# Patient Record
Sex: Female | Born: 1965 | Race: White | Hispanic: No | Marital: Married | Smoking: Never smoker
Health system: Southern US, Community
[De-identification: ages and names within clinical notes are randomized; demographics above are authoritative.]

## PROBLEM LIST (undated history)

## (undated) DIAGNOSIS — G8929 Other chronic pain: Secondary | ICD-10-CM

## (undated) DIAGNOSIS — E785 Hyperlipidemia, unspecified: Secondary | ICD-10-CM

## (undated) DIAGNOSIS — I10 Essential (primary) hypertension: Secondary | ICD-10-CM

## (undated) DIAGNOSIS — E119 Type 2 diabetes mellitus without complications: Secondary | ICD-10-CM

## (undated) DIAGNOSIS — T7840XA Allergy, unspecified, initial encounter: Secondary | ICD-10-CM

## (undated) HISTORY — DX: Allergy, unspecified, initial encounter: T78.40XA

## (undated) HISTORY — PX: ABDOMINAL HYSTERECTOMY: SHX81

## (undated) HISTORY — DX: Other chronic pain: G89.29

## (undated) HISTORY — DX: Hyperlipidemia, unspecified: E78.5

---

## 1998-01-23 ENCOUNTER — Ambulatory Visit (HOSPITAL_COMMUNITY): Admission: RE | Admit: 1998-01-23 | Discharge: 1998-01-23 | Payer: Self-pay | Admitting: Family Medicine

## 1999-04-29 ENCOUNTER — Other Ambulatory Visit: Admission: RE | Admit: 1999-04-29 | Discharge: 1999-04-29 | Payer: Self-pay | Admitting: Obstetrics & Gynecology

## 1999-08-30 ENCOUNTER — Encounter: Admission: RE | Admit: 1999-08-30 | Discharge: 1999-11-28 | Payer: Self-pay | Admitting: Obstetrics & Gynecology

## 1999-11-25 ENCOUNTER — Inpatient Hospital Stay (HOSPITAL_COMMUNITY): Admission: AD | Admit: 1999-11-25 | Discharge: 1999-11-28 | Payer: Self-pay | Admitting: Obstetrics and Gynecology

## 2000-01-06 ENCOUNTER — Other Ambulatory Visit: Admission: RE | Admit: 2000-01-06 | Discharge: 2000-01-06 | Payer: Self-pay | Admitting: Obstetrics & Gynecology

## 2004-04-08 ENCOUNTER — Ambulatory Visit: Admission: RE | Admit: 2004-04-08 | Discharge: 2004-04-08 | Payer: Self-pay | Admitting: Internal Medicine

## 2004-05-31 ENCOUNTER — Ambulatory Visit (HOSPITAL_COMMUNITY): Admission: RE | Admit: 2004-05-31 | Discharge: 2004-05-31 | Payer: Self-pay | Admitting: Family Medicine

## 2005-05-07 ENCOUNTER — Ambulatory Visit (HOSPITAL_COMMUNITY): Admission: RE | Admit: 2005-05-07 | Discharge: 2005-05-07 | Payer: Self-pay | Admitting: Family Medicine

## 2005-06-27 ENCOUNTER — Ambulatory Visit (HOSPITAL_COMMUNITY): Admission: RE | Admit: 2005-06-27 | Discharge: 2005-06-27 | Payer: Self-pay | Admitting: Family Medicine

## 2005-07-16 ENCOUNTER — Inpatient Hospital Stay (HOSPITAL_COMMUNITY): Admission: AD | Admit: 2005-07-16 | Discharge: 2005-07-16 | Payer: Self-pay | Admitting: Obstetrics & Gynecology

## 2005-07-30 ENCOUNTER — Inpatient Hospital Stay (HOSPITAL_COMMUNITY): Admission: AD | Admit: 2005-07-30 | Discharge: 2005-07-30 | Payer: Self-pay | Admitting: Obstetrics

## 2005-08-08 ENCOUNTER — Inpatient Hospital Stay (HOSPITAL_COMMUNITY): Admission: RE | Admit: 2005-08-08 | Discharge: 2005-08-11 | Payer: Self-pay | Admitting: Obstetrics

## 2006-02-09 ENCOUNTER — Ambulatory Visit (HOSPITAL_COMMUNITY): Admission: RE | Admit: 2006-02-09 | Discharge: 2006-02-09 | Payer: Self-pay | Admitting: Obstetrics

## 2008-09-22 HISTORY — PX: TOTAL ABDOMINAL HYSTERECTOMY W/ BILATERAL SALPINGOOPHORECTOMY: SHX83

## 2014-05-22 ENCOUNTER — Ambulatory Visit: Payer: Self-pay | Admitting: Internal Medicine

## 2014-07-26 ENCOUNTER — Ambulatory Visit: Payer: Self-pay | Admitting: Internal Medicine

## 2015-10-24 ENCOUNTER — Emergency Department (HOSPITAL_COMMUNITY): Payer: Commercial Managed Care - PPO

## 2015-10-24 ENCOUNTER — Emergency Department (HOSPITAL_COMMUNITY)
Admission: EM | Admit: 2015-10-24 | Discharge: 2015-10-24 | Disposition: A | Discharge status: Home / Self Care | Payer: Commercial Managed Care - PPO | Attending: Emergency Medicine | Admitting: Emergency Medicine

## 2015-10-24 ENCOUNTER — Encounter (HOSPITAL_COMMUNITY): Payer: Self-pay | Admitting: Emergency Medicine

## 2015-10-24 DIAGNOSIS — S29001A Unspecified injury of muscle and tendon of front wall of thorax, initial encounter: Secondary | ICD-10-CM | POA: Diagnosis not present

## 2015-10-24 DIAGNOSIS — Y998 Other external cause status: Secondary | ICD-10-CM | POA: Insufficient documentation

## 2015-10-24 DIAGNOSIS — Z79899 Other long term (current) drug therapy: Secondary | ICD-10-CM | POA: Insufficient documentation

## 2015-10-24 DIAGNOSIS — Y92410 Unspecified street and highway as the place of occurrence of the external cause: Secondary | ICD-10-CM | POA: Diagnosis not present

## 2015-10-24 DIAGNOSIS — Z7984 Long term (current) use of oral hypoglycemic drugs: Secondary | ICD-10-CM | POA: Insufficient documentation

## 2015-10-24 DIAGNOSIS — S4992XA Unspecified injury of left shoulder and upper arm, initial encounter: Secondary | ICD-10-CM | POA: Insufficient documentation

## 2015-10-24 DIAGNOSIS — S6992XA Unspecified injury of left wrist, hand and finger(s), initial encounter: Secondary | ICD-10-CM | POA: Diagnosis not present

## 2015-10-24 DIAGNOSIS — E119 Type 2 diabetes mellitus without complications: Secondary | ICD-10-CM | POA: Insufficient documentation

## 2015-10-24 DIAGNOSIS — Z880 Allergy status to penicillin: Secondary | ICD-10-CM | POA: Insufficient documentation

## 2015-10-24 DIAGNOSIS — I10 Essential (primary) hypertension: Secondary | ICD-10-CM | POA: Insufficient documentation

## 2015-10-24 DIAGNOSIS — Y9389 Activity, other specified: Secondary | ICD-10-CM | POA: Diagnosis not present

## 2015-10-24 HISTORY — DX: Type 2 diabetes mellitus without complications: E11.9

## 2015-10-24 HISTORY — DX: Essential (primary) hypertension: I10

## 2015-10-24 MED ORDER — HYDROCODONE-ACETAMINOPHEN 5-325 MG PO TABS: 2.0000 | ORAL_TABLET | ORAL | Status: DC | PRN

## 2015-10-24 MED ORDER — CYCLOBENZAPRINE HCL 10 MG PO TABS: 10.0000 mg | ORAL_TABLET | Freq: Three times a day (TID) | ORAL | Status: DC | PRN
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
## 2015-10-24 NOTE — Discharge Instructions (Signed)
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
## 2016-12-13 DIAGNOSIS — I10 Essential (primary) hypertension: Secondary | ICD-10-CM | POA: Diagnosis not present

## 2016-12-13 DIAGNOSIS — E119 Type 2 diabetes mellitus without complications: Secondary | ICD-10-CM | POA: Diagnosis not present

## 2016-12-15 DIAGNOSIS — K219 Gastro-esophageal reflux disease without esophagitis: Secondary | ICD-10-CM | POA: Diagnosis not present

## 2016-12-15 DIAGNOSIS — E118 Type 2 diabetes mellitus with unspecified complications: Secondary | ICD-10-CM | POA: Diagnosis not present
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
## 2017-04-15 DIAGNOSIS — E119 Type 2 diabetes mellitus without complications: Secondary | ICD-10-CM | POA: Diagnosis not present

## 2017-04-15 DIAGNOSIS — I110 Hypertensive heart disease with heart failure: Secondary | ICD-10-CM | POA: Diagnosis not present

## 2017-08-18 DIAGNOSIS — I10 Essential (primary) hypertension: Secondary | ICD-10-CM | POA: Diagnosis not present

## 2017-08-18 DIAGNOSIS — E118 Type 2 diabetes mellitus with unspecified complications: Secondary | ICD-10-CM | POA: Diagnosis not present

## 2017-08-18 DIAGNOSIS — G5603 Carpal tunnel syndrome, bilateral upper limbs: Secondary | ICD-10-CM | POA: Diagnosis not present

## 2017-09-05 DIAGNOSIS — M542 Cervicalgia: Secondary | ICD-10-CM | POA: Diagnosis not present

## 2018-07-22 DIAGNOSIS — E1100 Type 2 diabetes mellitus with hyperosmolarity without nonketotic hyperglycemic-hyperosmolar coma (NKHHC): Secondary | ICD-10-CM | POA: Diagnosis not present

## 2018-07-22 DIAGNOSIS — E1149 Type 2 diabetes mellitus with other diabetic neurological complication: Secondary | ICD-10-CM | POA: Diagnosis not present

## 2018-07-22 DIAGNOSIS — E119 Type 2 diabetes mellitus without complications: Secondary | ICD-10-CM | POA: Diagnosis not present

## 2018-07-22 DIAGNOSIS — Z23 Encounter for immunization: Secondary | ICD-10-CM | POA: Diagnosis not present

## 2018-07-22 DIAGNOSIS — I10 Essential (primary) hypertension: Secondary | ICD-10-CM | POA: Diagnosis not present

## 2018-07-22 DIAGNOSIS — E118 Type 2 diabetes mellitus with unspecified complications: Secondary | ICD-10-CM | POA: Diagnosis not present

## 2018-08-02 DIAGNOSIS — I10 Essential (primary) hypertension: Secondary | ICD-10-CM | POA: Diagnosis not present

## 2018-08-02 DIAGNOSIS — E6609 Other obesity due to excess calories: Secondary | ICD-10-CM | POA: Diagnosis not present

## 2018-08-02 DIAGNOSIS — E119 Type 2 diabetes mellitus without complications: Secondary | ICD-10-CM | POA: Diagnosis not present

## 2018-09-17 DIAGNOSIS — R799 Abnormal finding of blood chemistry, unspecified: Secondary | ICD-10-CM | POA: Diagnosis not present

## 2018-09-17 DIAGNOSIS — E119 Type 2 diabetes mellitus without complications: Secondary | ICD-10-CM | POA: Diagnosis not present

## 2018-09-20 DIAGNOSIS — I10 Essential (primary) hypertension: Secondary | ICD-10-CM | POA: Diagnosis not present

## 2018-09-20 DIAGNOSIS — E119 Type 2 diabetes mellitus without complications: Secondary | ICD-10-CM | POA: Diagnosis not present

## 2018-09-20 DIAGNOSIS — Z6836 Body mass index (BMI) 36.0-36.9, adult: Secondary | ICD-10-CM | POA: Diagnosis not present

## 2018-10-18 DIAGNOSIS — M25512 Pain in left shoulder: Secondary | ICD-10-CM | POA: Diagnosis not present

## 2018-11-08 DIAGNOSIS — E119 Type 2 diabetes mellitus without complications: Secondary | ICD-10-CM | POA: Diagnosis not present

## 2018-11-08 DIAGNOSIS — E6609 Other obesity due to excess calories: Secondary | ICD-10-CM | POA: Diagnosis not present

## 2018-11-08 DIAGNOSIS — I10 Essential (primary) hypertension: Secondary | ICD-10-CM | POA: Diagnosis not present

## 2018-11-08 DIAGNOSIS — K5900 Constipation, unspecified: Secondary | ICD-10-CM | POA: Diagnosis not present

## 2019-02-07 DIAGNOSIS — E785 Hyperlipidemia, unspecified: Secondary | ICD-10-CM | POA: Diagnosis not present

## 2019-02-07 DIAGNOSIS — E1169 Type 2 diabetes mellitus with other specified complication: Secondary | ICD-10-CM | POA: Diagnosis not present

## 2019-02-07 DIAGNOSIS — E789 Disorder of lipoprotein metabolism, unspecified: Secondary | ICD-10-CM | POA: Diagnosis not present

## 2019-02-07 DIAGNOSIS — I10 Essential (primary) hypertension: Secondary | ICD-10-CM | POA: Diagnosis not present

## 2019-09-01 ENCOUNTER — Other Ambulatory Visit: Payer: Self-pay | Admitting: Family Medicine

## 2019-09-01 DIAGNOSIS — Z1231 Encounter for screening mammogram for malignant neoplasm of breast: Secondary | ICD-10-CM

## 2019-09-02 ENCOUNTER — Other Ambulatory Visit: Payer: Self-pay

## 2019-09-02 ENCOUNTER — Ambulatory Visit
Admission: RE | Admit: 2019-09-02 | Discharge: 2019-09-02 | Disposition: A | Discharge status: Home / Self Care | Payer: Commercial Managed Care - PPO | Source: Ambulatory Visit | Attending: Family Medicine | Admitting: Family Medicine

## 2019-09-02 DIAGNOSIS — Z1231 Encounter for screening mammogram for malignant neoplasm of breast: Secondary | ICD-10-CM

## 2020-09-05 ENCOUNTER — Other Ambulatory Visit: Payer: Self-pay | Admitting: Family Medicine

## 2020-09-05 DIAGNOSIS — Z1231 Encounter for screening mammogram for malignant neoplasm of breast: Secondary | ICD-10-CM

## 2020-10-18 ENCOUNTER — Ambulatory Visit
Admission: RE | Admit: 2020-10-18 | Discharge: 2020-10-18 | Disposition: A | Discharge status: Home / Self Care | Payer: Commercial Managed Care - PPO | Source: Ambulatory Visit | Attending: Family Medicine | Admitting: Family Medicine

## 2020-10-18 ENCOUNTER — Other Ambulatory Visit: Payer: Self-pay

## 2020-10-18 DIAGNOSIS — Z1231 Encounter for screening mammogram for malignant neoplasm of breast: Secondary | ICD-10-CM

## 2020-12-27 ENCOUNTER — Other Ambulatory Visit: Payer: Self-pay

## 2020-12-27 ENCOUNTER — Ambulatory Visit (INDEPENDENT_AMBULATORY_CARE_PROVIDER_SITE_OTHER): Payer: Commercial Managed Care - PPO | Admitting: Medical

## 2020-12-27 ENCOUNTER — Encounter: Payer: Self-pay | Admitting: Medical

## 2020-12-27 VITALS — BP 144/90 | HR 95 | Ht 68.0 in | Wt 213.4 lb

## 2020-12-27 DIAGNOSIS — E1169 Type 2 diabetes mellitus with other specified complication: Secondary | ICD-10-CM | POA: Diagnosis not present

## 2020-12-27 DIAGNOSIS — E1165 Type 2 diabetes mellitus with hyperglycemia: Secondary | ICD-10-CM | POA: Diagnosis not present

## 2020-12-27 DIAGNOSIS — M549 Dorsalgia, unspecified: Secondary | ICD-10-CM | POA: Insufficient documentation

## 2020-12-27 DIAGNOSIS — F439 Reaction to severe stress, unspecified: Secondary | ICD-10-CM | POA: Diagnosis not present

## 2020-12-27 DIAGNOSIS — I10 Essential (primary) hypertension: Secondary | ICD-10-CM | POA: Diagnosis not present

## 2020-12-27 DIAGNOSIS — E785 Hyperlipidemia, unspecified: Secondary | ICD-10-CM

## 2020-12-27 DIAGNOSIS — F5089 Other specified eating disorder: Secondary | ICD-10-CM | POA: Insufficient documentation

## 2020-12-27 MED ORDER — TELMISARTAN-HCTZ 80-12.5 MG PO TABS: ORAL_TABLET | ORAL | 1 refills | Status: DC

## 2020-12-27 MED ORDER — TOUJEO SOLOSTAR 300 UNIT/ML ~~LOC~~ SOPN: 30.0000 [IU] | PEN_INJECTOR | Freq: Every day | SUBCUTANEOUS | 2 refills | Status: DC

## 2020-12-27 MED ORDER — ROSUVASTATIN CALCIUM 10 MG PO TABS: ORAL_TABLET | ORAL | 1 refills | Status: DC

## 2020-12-27 MED ORDER — DAPAGLIFLOZIN PROPANEDIOL 5 MG PO TABS: 5.0000 mg | ORAL_TABLET | Freq: Every day | ORAL | 2 refills | Status: DC

## 2020-12-27 MED ORDER — TRULICITY 4.5 MG/0.5ML ~~LOC~~ SOAJ: SUBCUTANEOUS | 5 refills | Status: DC

## 2020-12-27 NOTE — Progress Notes (Signed)
Referral has been sent.

## 2020-12-27 NOTE — Progress Notes (Signed)
Subjective:  Audrey King is a 55 y.o. female who presents for Chief Complaint  Patient presents with  . New Patient (Initial Visit)    New patient get established      New today to establish are.   Was seeing Dr. Parke Simmers prior  Diabetes - Currently taking Toujeo 30 u daily, Trulicity 4.5mg  weekly.  Took last Trulicity last week.   Checks glucose.   Lately numbers have been high, in the 200s.  Last HgbA1C was around 6.8%.   HTN - taking Telmisartan HCT 80/12.5mg  daily, but ran out of medication 2 weeks ago.  Sometimes checks BP.  Has bee a little high off medication  Hyperlipdiemia - taking Crestor 10mg  daily, but ran out 2 weeks ago  Hurt back recently.  She notes low back pain after lifting some things last week.  This past weekend was in bed all day Saturday and Sunday due to back pain.  Worse in morning but does improve after getting going. Some pain in legs if standing prolonged.  No numbness, no tingling, no weakness.   Using OTC analgesics, heat pad.    No other aggravating or relieving factors.    No other c/o.  The following portions of the patient's history were reviewed and updated as appropriate: allergies, current medications, past family history, past medical history, past social history, past surgical history and problem list.  ROS Otherwise as in subjective above    Objective: BP (!) 144/90   Pulse 95   Ht 5\' 8"  (1.727 m)   Wt 213 lb 6.4 oz (96.8 kg)   SpO2 97%   BMI 32.45 kg/m    BP Readings from Last 3 Encounters:  12/27/20 (!) 144/90  10/24/15 105/66   General appearance: alert, no distress, well developed, well nourished Neck: supple, no lymphadenopathy, no thyromegaly, no masses, no bruits Heart: RRR, normal S1, S2, no murmurs Lungs: CTA bilaterally, no wheezes, rhonchi, or rales Pulses: 2+ radial pulses, 2+ pedal pulses, normal cap refill Ext: no edema In pain, slow to move with back, slow getting out of chair    Assessment: Encounter  Diagnoses  Name Primary?  02/26/21 Uncontrolled type 2 diabetes mellitus with hyperglycemia (HCC) Yes  . Hyperlipidemia associated with type 2 diabetes mellitus (HCC)   . Essential hypertension, benign   . Stress at home   . Psychogenic overeating   . Acute back pain, unspecified back location, unspecified back pain laterality      Plan: Lener was seen today for new patient (initial visit).  Diagnoses and all orders for this visit:  Uncontrolled type 2 diabetes mellitus with hyperglycemia (HCC) We discussed her current glucose readings at home.  Add Farxiga 1 tablet daily.  I gave samples and sent prescription.  Discussed risk and benefits of medicine, proper use of medicine.  Continue Trulicity.  Continue Toujeo.  Follow-up in 1 month fasting  Hyperlipidemia associated with type 2 diabetes mellitus (HCC) Continue statin, have been out for the last 2 weeks.  Recheck in 1 month fasting  Essential hypertension, benign Continue medication.  She has been out the last 2 weeks.  Follow-up in 1 month fasting  Stress at home Psychogenic overeating Referral to counseling through [facility]  Acute back pain, unspecified back location, unspecified back pain laterality I advise she contact her employer as this could be a Worker's Comp. issue.  I advised that I could not give any treatment or recommendations as she needs to first go through the proper channels  at [facility].  She was not aware of this.   Other orders -     insulin glargine, 1 Unit Dial, (TOUJEO SOLOSTAR) 300 UNIT/ML Solostar Pen; Inject 30 Units into the skin daily. -     rosuvastatin (CRESTOR) 10 MG tablet; rosuvastatin 10 mg tablet -     telmisartan-hydrochlorothiazide (MICARDIS HCT) 80-12.5 MG tablet; telmisartan 80 mg-hydrochlorothiazide 12.5 mg tablet -     TRULICITY 4.5 MG/0.5ML SOPN; SMARTSIG:1 SUB-Q Once a Week -     dapagliflozin propanediol (FARXIGA) 5 MG TABS tablet; Take 1 tablet (5 mg total) by mouth daily before  breakfast.   Follow up: 19mo fasting for physical

## 2021-01-01 ENCOUNTER — Telehealth: Payer: Self-pay

## 2021-01-01 ENCOUNTER — Other Ambulatory Visit: Payer: Self-pay | Admitting: Medical

## 2021-01-01 MED ORDER — ROSUVASTATIN CALCIUM 10 MG PO TABS: 10.0000 mg | ORAL_TABLET | Freq: Every day | ORAL | 1 refills | Status: DC

## 2021-01-01 MED ORDER — TELMISARTAN-HCTZ 80-12.5 MG PO TABS: 1.0000 | ORAL_TABLET | Freq: Every day | ORAL | 1 refills | Status: DC

## 2021-01-01 NOTE — Telephone Encounter (Signed)
Not sure how the meds went through like that , but I just re-sent them

## 2021-01-01 NOTE — Telephone Encounter (Signed)
Pt states Trulicity requires P.A.  Called pharmacy and they will fax.  Completed P.A. and states no P.A. needed Called [facility] t# [phone] ID# [id number] and was informed no P.A. needed pharmacy needs to bill as 2 for 30 days, test claim went thru.  Called pharmacy s/w Elon Jester and she couldn't get it to go thru issue is ml verses pen.  She will call insurance and try to get it straightened out and call back if have issues.

## 2021-01-01 NOTE — Telephone Encounter (Signed)
Called pharmacy & went thru for $25, called pt and informed

## 2021-01-01 NOTE — Telephone Encounter (Signed)
Pt called and states couldn't pick up Crestor or Micardis because they didn't have directions.  Please send directions to pharmacy

## 2021-01-09 NOTE — Telephone Encounter (Signed)
done

## 2021-01-11 NOTE — Telephone Encounter (Signed)
done

## 2021-01-28 ENCOUNTER — Telehealth: Payer: Self-pay | Admitting: Medical

## 2021-01-28 NOTE — Telephone Encounter (Signed)
Pt called and states that she has a yeast infection states that you told her that the farxiga would cause one, and she is having burning and itching down there and she wants to know if you will call her something in  Pt uses [facility] - [place],  - [address]

## 2021-01-29 ENCOUNTER — Other Ambulatory Visit: Payer: Self-pay | Admitting: Medical

## 2021-01-29 MED ORDER — FLUCONAZOLE 150 MG PO TABS: 150.0000 mg | ORAL_TABLET | Freq: Once | ORAL | 0 refills | Status: AC

## 2021-01-29 NOTE — Telephone Encounter (Signed)
Diflucan sent for yeast infection,  f/u soon for 16mo f/u from last visit Vincenza Hews

## 2021-01-31 ENCOUNTER — Ambulatory Visit: Payer: Commercial Managed Care - PPO | Admitting: Medical

## 2021-04-25 ENCOUNTER — Telehealth: Payer: Self-pay | Admitting: Internal Medicine

## 2021-04-25 NOTE — Telephone Encounter (Signed)
Left message for pt to call back to schedule appointment- needs DM/cpe

## 2021-05-01 ENCOUNTER — Telehealth: Payer: Self-pay | Admitting: Internal Medicine

## 2021-05-01 NOTE — Telephone Encounter (Signed)
Left message for pt to call back to schedule fasting f/u

## 2021-05-24 ENCOUNTER — Telehealth: Payer: Self-pay | Admitting: Internal Medicine

## 2021-05-24 NOTE — Telephone Encounter (Signed)
Left message for pt to call back to schedule an appt 

## 2021-06-24 ENCOUNTER — Other Ambulatory Visit: Payer: Self-pay

## 2021-06-24 ENCOUNTER — Ambulatory Visit (INDEPENDENT_AMBULATORY_CARE_PROVIDER_SITE_OTHER): Payer: Commercial Managed Care - PPO | Admitting: Medical

## 2021-06-24 VITALS — BP 130/80 | HR 78 | Wt 206.6 lb

## 2021-06-24 DIAGNOSIS — I10 Essential (primary) hypertension: Secondary | ICD-10-CM | POA: Diagnosis not present

## 2021-06-24 DIAGNOSIS — G8929 Other chronic pain: Secondary | ICD-10-CM | POA: Insufficient documentation

## 2021-06-24 DIAGNOSIS — E1165 Type 2 diabetes mellitus with hyperglycemia: Secondary | ICD-10-CM

## 2021-06-24 DIAGNOSIS — M5442 Lumbago with sciatica, left side: Secondary | ICD-10-CM

## 2021-06-24 DIAGNOSIS — E785 Hyperlipidemia, unspecified: Secondary | ICD-10-CM

## 2021-06-24 DIAGNOSIS — Z23 Encounter for immunization: Secondary | ICD-10-CM | POA: Insufficient documentation

## 2021-06-24 DIAGNOSIS — E1169 Type 2 diabetes mellitus with other specified complication: Secondary | ICD-10-CM

## 2021-06-24 DIAGNOSIS — Z1211 Encounter for screening for malignant neoplasm of colon: Secondary | ICD-10-CM

## 2021-06-24 NOTE — Progress Notes (Signed)
Subjective:  Audrey King is a 55 y.o. female who presents for Chief Complaint  Patient presents with   fasting follow-up    Fasting follow-up. Would like flu shot today. No colonoscopy and no pap smear       Here for second visit.   Last visit was her first visit here in 12/2020.  Diabetes - sugars still elevated, no mater what she does.   Checks every other day.  Fasting morning sugars often 150-160s, sometimes higher.  Using Toujeo 38u, trulicity 4.5mg  weekly.  No foot concerns.  Sometimes gets some burning in feet for years, but not bad.   Is due for eye doctor visit.    HTN - taking Telmisartan HCT 80/12.5mg  daily.  Checks BPs once weekly, usually normal at home.  Hyperlipidemia - taking Crestor 10mg  daily    Takes daily multivitamin  Exercise - nothing currently.  Hurt back earlier the year.  It was mid June before she got done with physical therapy. However, had started having sciatica problems weeks later, left leg, low back. Constantly walking, working 2 retail jobs.  Been dealing with low back pain, sciatica.   Had flare up recent months, but lately a little better.  Morning times ok for the most part as long as she is active.   No injury, no fall, no trauma.  Was on gabapentin at one point in the past, but eventually weaned off this.  No other aggravating or relieving factors.    No other c/o.  Past Medical History:  Diagnosis Date   Diabetes mellitus without complication (HCC)    Hypertension    Current Outpatient Medications on File Prior to Visit  Medication Sig Dispense Refill   cetirizine (ZYRTEC) 10 MG tablet Take 10 mg by mouth daily.     insulin glargine, 1 Unit Dial, (TOUJEO SOLOSTAR) 300 UNIT/ML Solostar Pen Inject 30 Units into the skin daily. 9 mL 2   Multiple Vitamin (MULTIVITAMIN) tablet Take 1 tablet by mouth daily.     rosuvastatin (CRESTOR) 10 MG tablet Take 1 tablet (10 mg total) by mouth daily. rosuvastatin 10 mg tablet 90 tablet 1    telmisartan-hydrochlorothiazide (MICARDIS HCT) 80-12.5 MG tablet Take 1 tablet by mouth daily. telmisartan 80 mg-hydrochlorothiazide 12.5 mg tablet 90 tablet 1   TRULICITY 4.5 MG/0.5ML SOPN SMARTSIG:1 SUB-Q Once a Week 2 mL 5   No current facility-administered medications on file prior to visit.     The following portions of the patient's history were reviewed and updated as appropriate: allergies, current medications, past family history, past medical history, past social history, past surgical history and problem list.  ROS Otherwise as in subjective above    Objective: BP 130/80   Pulse 78   Wt 206 lb 9.6 oz (93.7 kg)   BMI 31.41 kg/m    BP Readings from Last 3 Encounters:  06/24/21 130/80  12/27/20 (!) 144/90  10/24/15 105/66   Wt Readings from Last 3 Encounters:  06/24/21 206 lb 9.6 oz (93.7 kg)  12/27/20 213 lb 6.4 oz (96.8 kg)  10/24/15 195 lb (88.5 kg)    General appearance: alert, no distress, well developed, well nourished Neck: supple, no lymphadenopathy, no thyromegaly, no masses, no bruits Heart: RRR, normal S1, S2, no murmurs Lungs: CTA bilaterally, no wheezes, rhonchi, or rales Pulses: 2+ radial pulses, 2+ pedal pulses, normal cap refill Ext: no edema Back: nontender, no deformity Legs nontender, decreased bilat hip internal ROM, but ext ROM fine.  Diabetic Foot Exam - Simple   Simple Foot Form Diabetic Foot exam was performed with the following findings: Yes 06/24/2021 11:20 AM  Visual Inspection No deformities, no ulcerations, no other skin breakdown bilaterally: Yes Sensation Testing See comments: Yes Pulse Check Posterior Tibialis and Dorsalis pulse intact bilaterally: Yes Comments She had hard time feeling some toes with 5.0 monofilament but felt all sensation with 6.0 both feet       Assessment: Encounter Diagnoses  Name Primary?   Uncontrolled type 2 diabetes mellitus with hyperglycemia (HCC) Yes   Needs flu shot    Hyperlipidemia  associated with type 2 diabetes mellitus (HCC)    Essential hypertension, benign    Chronic bilateral low back pain with left-sided sciatica    Screen for colon cancer      Plan: Diabets - updated labs today, continue current medication,regular glucose testing, daily foot checks, see eye doctor soon for yearly f/u  Hyperlipidemia - continue statin, updated labs today  HTN - c/t current medication, she notes normal home BPs  Referral to gastroenterology for 1st screen colonoscopy  Counseled on the influenza virus vaccine.  Vaccine information sheet given.  Influenza vaccine given after consent obtained.  Low back pain, sciatica - offered neuropathic medication, imaging.  She will consider.  She will work on stretching and exercise for now.  Just saw physical therapy recently   Audrey King was seen today for fasting follow-up.  Diagnoses and all orders for this visit:  Uncontrolled type 2 diabetes mellitus with hyperglycemia (HCC) -     Hemoglobin A1c -     Microalbumin/Creatinine Ratio, Urine  Needs flu shot -     Flu Vaccine QUAD 62mo+IM (Fluarix, Fluzone & Alfiuria Quad PF)  Hyperlipidemia associated with type 2 diabetes mellitus (HCC) -     Comprehensive metabolic panel -     Lipid panel  Essential hypertension, benign  Chronic bilateral low back pain with left-sided sciatica  Screen for colon cancer -     Ambulatory referral to Gastroenterology   Follow up: pending labs

## 2021-06-25 LAB — HEMOGLOBIN A1C
Est. average glucose Bld gHb Est-mCnc: 258 mg/dL
Hgb A1c MFr Bld: 10.6 % — ABNORMAL HIGH (ref 4.8–5.6)

## 2021-06-25 LAB — COMPREHENSIVE METABOLIC PANEL
ALT: 20 IU/L (ref 0–32)
AST: 21 IU/L (ref 0–40)
Albumin/Globulin Ratio: 2.4 — ABNORMAL HIGH (ref 1.2–2.2)
Albumin: 4.5 g/dL (ref 3.8–4.9)
Alkaline Phosphatase: 44 IU/L (ref 44–121)
BUN/Creatinine Ratio: 28 — ABNORMAL HIGH (ref 9–23)
BUN: 18 mg/dL (ref 6–24)
Bilirubin Total: 0.9 mg/dL (ref 0.0–1.2)
CO2: 24 mmol/L (ref 20–29)
Calcium: 10.5 mg/dL — ABNORMAL HIGH (ref 8.7–10.2)
Chloride: 98 mmol/L (ref 96–106)
Creatinine, Ser: 0.64 mg/dL (ref 0.57–1.00)
Globulin, Total: 1.9 g/dL (ref 1.5–4.5)
Glucose: 208 mg/dL — ABNORMAL HIGH (ref 70–99)
Potassium: 4.1 mmol/L (ref 3.5–5.2)
Sodium: 137 mmol/L (ref 134–144)
Total Protein: 6.4 g/dL (ref 6.0–8.5)
eGFR: 104 mL/min/{1.73_m2} (ref >59)

## 2021-06-25 LAB — LIPID PANEL
Chol/HDL Ratio: 3.6 ratio (ref 0.0–4.4)
Cholesterol, Total: 148 mg/dL (ref 100–199)
HDL: 41 mg/dL (ref >39)
LDL Chol Calc (NIH): 86 mg/dL (ref 0–99)
Triglycerides: 116 mg/dL (ref 0–149)
VLDL Cholesterol Cal: 21 mg/dL (ref 5–40)

## 2021-06-25 LAB — MICROALBUMIN / CREATININE URINE RATIO
Creatinine, Urine: 177.8 mg/dL
Microalb/Creat Ratio: 13 mg/g creat (ref 0–29)
Microalbumin, Urine: 23.3 ug/mL

## 2021-06-26 ENCOUNTER — Other Ambulatory Visit: Payer: Self-pay | Admitting: Medical

## 2021-06-26 MED ORDER — TOUJEO SOLOSTAR 300 UNIT/ML ~~LOC~~ SOPN: 41.0000 [IU] | PEN_INJECTOR | Freq: Every day | SUBCUTANEOUS | 3 refills | Status: DC

## 2021-06-26 MED ORDER — TRULICITY 4.5 MG/0.5ML ~~LOC~~ SOAJ: SUBCUTANEOUS | 5 refills | Status: DC

## 2021-06-26 MED ORDER — ROSUVASTATIN CALCIUM 10 MG PO TABS: 10.0000 mg | ORAL_TABLET | Freq: Every day | ORAL | 1 refills | Status: DC

## 2021-06-26 MED ORDER — TELMISARTAN-HCTZ 80-12.5 MG PO TABS: 1.0000 | ORAL_TABLET | Freq: Every day | ORAL | 3 refills | Status: DC

## 2021-07-01 ENCOUNTER — Telehealth: Payer: Self-pay

## 2021-07-01 NOTE — Telephone Encounter (Signed)
Pt called & states she has new insurance and Toujeo now over $100 & she can't afford it.  Advised will call in a discount card.  Activated card and called pharmacy Toujeo went down to $0.  Called pt & informed, also went ahead and faxed in discount card for Trulicity to see if will  be any cheaper.

## 2021-09-18 ENCOUNTER — Other Ambulatory Visit: Payer: Self-pay | Admitting: Medical

## 2021-09-18 DIAGNOSIS — Z1231 Encounter for screening mammogram for malignant neoplasm of breast: Secondary | ICD-10-CM

## 2021-09-24 ENCOUNTER — Encounter: Payer: Commercial Managed Care - PPO | Admitting: Medical

## 2021-09-24 ENCOUNTER — Telehealth: Payer: Self-pay

## 2021-09-24 ENCOUNTER — Other Ambulatory Visit: Payer: Self-pay | Admitting: Medical

## 2021-09-24 MED ORDER — TRESIBA FLEXTOUCH 100 UNIT/ML ~~LOC~~ SOPN: 41.0000 [IU] | PEN_INJECTOR | Freq: Every day | SUBCUTANEOUS | 0 refills | Status: DC

## 2021-09-24 NOTE — Telephone Encounter (Signed)
Pt informed and she has CPE already scheduled 11/07/21

## 2021-09-24 NOTE — Telephone Encounter (Signed)
Pt called and states her insurance will no longer cover Toujeo, they will cover [place] or [place].  Can you switch her to covered alternative and send to [facility] ?

## 2021-10-16 LAB — HM DIABETES EYE EXAM

## 2021-10-21 ENCOUNTER — Other Ambulatory Visit: Payer: Self-pay | Admitting: Medical

## 2021-10-21 ENCOUNTER — Ambulatory Visit: Payer: Commercial Managed Care - PPO

## 2021-10-21 ENCOUNTER — Telehealth: Payer: Self-pay | Admitting: Medical

## 2021-10-21 MED ORDER — OZEMPIC (1 MG/DOSE) 4 MG/3ML ~~LOC~~ SOPN: 1.0000 mg | PEN_INJECTOR | SUBCUTANEOUS | 1 refills | Status: DC

## 2021-10-21 NOTE — Telephone Encounter (Signed)
Pt called concerning Trulicity. Pt states that she can not get filled due to national backorder. Please advise pt on what she can now take. Pt uses [facility] [address] and can be reached at [phone].

## 2021-10-21 NOTE — Telephone Encounter (Signed)
Pt was notified.  

## 2021-10-28 ENCOUNTER — Ambulatory Visit
Admission: RE | Admit: 2021-10-28 | Discharge: 2021-10-28 | Disposition: A | Discharge status: Home / Self Care | Payer: Commercial Managed Care - PPO | Source: Ambulatory Visit | Attending: Medical | Admitting: Medical

## 2021-10-28 DIAGNOSIS — Z1231 Encounter for screening mammogram for malignant neoplasm of breast: Secondary | ICD-10-CM

## 2021-11-07 ENCOUNTER — Other Ambulatory Visit: Payer: Self-pay

## 2021-11-07 ENCOUNTER — Encounter: Payer: Self-pay | Admitting: Medical

## 2021-11-07 ENCOUNTER — Ambulatory Visit (INDEPENDENT_AMBULATORY_CARE_PROVIDER_SITE_OTHER): Payer: Commercial Managed Care - PPO | Admitting: Medical

## 2021-11-07 VITALS — BP 110/70 | HR 80 | Ht 64.5 in | Wt 201.0 lb

## 2021-11-07 DIAGNOSIS — Z7185 Encounter for immunization safety counseling: Secondary | ICD-10-CM | POA: Diagnosis not present

## 2021-11-07 DIAGNOSIS — Z136 Encounter for screening for cardiovascular disorders: Secondary | ICD-10-CM

## 2021-11-07 DIAGNOSIS — E2839 Other primary ovarian failure: Secondary | ICD-10-CM

## 2021-11-07 DIAGNOSIS — Z8249 Family history of ischemic heart disease and other diseases of the circulatory system: Secondary | ICD-10-CM

## 2021-11-07 DIAGNOSIS — I10 Essential (primary) hypertension: Secondary | ICD-10-CM

## 2021-11-07 DIAGNOSIS — R9431 Abnormal electrocardiogram [ECG] [EKG]: Secondary | ICD-10-CM

## 2021-11-07 DIAGNOSIS — Z0000 Encounter for general adult medical examination without abnormal findings: Secondary | ICD-10-CM

## 2021-11-07 DIAGNOSIS — Z1159 Encounter for screening for other viral diseases: Secondary | ICD-10-CM

## 2021-11-07 DIAGNOSIS — E1169 Type 2 diabetes mellitus with other specified complication: Secondary | ICD-10-CM | POA: Diagnosis not present

## 2021-11-07 DIAGNOSIS — E1165 Type 2 diabetes mellitus with hyperglycemia: Secondary | ICD-10-CM | POA: Diagnosis not present

## 2021-11-07 DIAGNOSIS — E785 Hyperlipidemia, unspecified: Secondary | ICD-10-CM

## 2021-11-07 DIAGNOSIS — E8940 Asymptomatic postprocedural ovarian failure: Secondary | ICD-10-CM

## 2021-11-07 DIAGNOSIS — Z8269 Family history of other diseases of the musculoskeletal system and connective tissue: Secondary | ICD-10-CM

## 2021-11-07 LAB — CBC

## 2021-11-07 NOTE — Patient Instructions (Signed)
This visit was a preventative care visit, also known as wellness visit or routine physical.   Topics typically include healthy lifestyle, diet, exercise, preventative care, vaccinations, sick and well care, proper use of emergency dept and after hours care, as well as other concerns.     Recommendations: Continue to return yearly for your annual wellness and preventative care visits.  This gives [place] a chance to discuss healthy lifestyle, exercise, vaccinations, review your chart record, and perform screenings where appropriate.  I recommend you see your eye doctor yearly for routine vision care.  I recommend you see your dentist yearly for routine dental care including hygiene visits twice yearly.   Vaccination recommendations were reviewed Immunization History  Administered Date(s) Administered   Influenza,inj,Quad PF,6+ Mos 06/24/2021   PFIZER(Purple Top)SARS-COV-2 Vaccination 11/30/2019, 12/21/2019, 09/05/2020   PNEUMOCOCCAL CONJUGATE-20 12/31/2020   Tdap 06/06/2021   Zoster Recombinat (Shingrix) 09/22/2014, 06/06/2021   She notes recent pneumococcal vaccine at [facility] few months ago . We will try and get info on this.  Likely had covid recently given symptoms, likely developing antibodies instead of getting booster vaccine today.   Screening for cancer: Colon cancer screening: colonoscopy pending 11/2021.  Breast cancer screening: You should perform a self breast exam monthly.   We reviewed recommendations for regular mammograms and breast cancer screening.  Reviewed 10/2021 normal mammogram  Cervical cancer screening: We reviewed recommendations for pap smear screening.  Have had prior hysterectomy, no longer needs pap smear.   Skin cancer screening: Check your skin regularly for new changes, growing lesions, or other lesions of concern Come in for evaluation if you have skin lesions of concern.  Lung cancer screening: If you have a greater than 20 pack year history of  tobacco use, then you may qualify for lung cancer screening with a chest CT scan.   Please call your insurance company to inquire about coverage for this test.  We currently don't have screenings for other cancers besides breast, cervical, colon, and lung cancers.  If you have a strong family history of cancer or have other cancer screening concerns, please let me know.    Bone health: Get at least 150 minutes of aerobic exercise weekly Get weight bearing exercise at least once weekly Bone density test:  A bone density test is an imaging test that uses a type of X-ray to measure the amount of calcium and other minerals in your bones. The test may be used to diagnose or screen you for a condition that causes weak or thin bones (osteoporosis), predict your risk for a broken bone (fracture), or determine how well your osteoporosis treatment is working. The bone density test is recommended for females 65 and older, or females or males <65 if certain risk factors such as thyroid disease, long term use of steroids such as for asthma or rheumatological issues, vitamin D deficiency, estrogen deficiency, family history of osteoporosis, self or family history of fragility fracture in first degree relative.  Consider baseline bone density test given s/p total hysterectomy and mother's history of fractures as an adult.     Please call to schedule your bone density test.   [facility]  [phone] [address], [address] [place], [place] [address]   Heart health: Get at least 150 minutes of aerobic exercise weekly Limit alcohol It is important to maintain a healthy blood pressure and healthy cholesterol numbers  Heart disease screening: Screening for heart disease includes screening for blood  pressure, fasting lipids, glucose/diabetes screening, BMI height to weight ratio, reviewed of smoking status, physical activity, and diet.    Goals include blood pressure  120/80 or less, maintaining a healthy lipid/cholesterol profile, preventing diabetes or keeping diabetes numbers under good control, not smoking or using tobacco products, exercising most days per week or at least 150 minutes per week of exercise, and eating healthy variety of fruits and vegetables, healthy oils, and avoiding unhealthy food choices like fried food, fast food, high sugar and high cholesterol foods.    Other tests may possibly include EKG test, CT coronary calcium score, echocardiogram, exercise treadmill stress test.   Consider CT coronary calcium score    Medical care options: I recommend you continue to seek care here first for routine care.  We try really hard to have available appointments Monday through Friday daytime hours for sick visits, acute visits, and physicals.  Urgent care should be used for after hours and weekends for significant issues that cannot wait till the next day.  The emergency department should be used for significant potentially life-threatening emergencies.  The emergency department is expensive, can often have long wait times for less significant concerns, so try to utilize primary care, urgent care, or telemedicine when possible to avoid unnecessary trips to the emergency department.  Virtual visits and telemedicine have been introduced since the pandemic started in 2020, and can be convenient ways to receive medical care.  We offer virtual appointments as well to assist you in a variety of options to seek medical care.    Advanced Directives: I recommend you consider completing a Health Care Power of Attorney and Living Will.   These documents respect your wishes and help alleviate burdens on your loved ones if you were to become terminally ill or be in a position to need those documents enforced.    You can complete Advanced Directives yourself, have them notarized, then have copies made for our office, for you and for anybody you feel should have them  in safe keeping.  Or, you can have an attorney prepare these documents.   If you haven't updated your Last Will and Testament in a while, it may be worthwhile having an attorney prepare these documents together and save on some costs.       Separate significant issues discussed: Diabetes - uncontrolled. Updated labs today.  Continue daily foot checks, yearly eye doctor visits.   Hypertension - compliant with medicaiton  Hyperlipidemia - compliant with statin  Recent URI, likely covid infection.   Still has some congestion, still has loss of smell and taste

## 2021-11-07 NOTE — Progress Notes (Signed)
Subjective:   HPI  Audrey King is a 56 y.o. female who presents for Chief Complaint  Patient presents with   fasting cpe    Fasting, cpe, was sick 6-7 weeks ago but still can't smell or taste and still having congestion. Just had mammogram, declines pelvic exam. Colonoscopy Dr. Loreta Ave- march 3rd    Patient Care Team: Kalene Cutler, Cleda Mccreedy as PCP - General (Family Medicine) Sees dentist Sees eye doctor Dr. Charna Elizabeth, GI  Concerns: Here for well visit and diabetes follow-up  Last visit October 2022.  at that time diabetes marker was way too high.  She has a colonoscopy scheduled with Dr. Loreta Ave on March 3 coming up soon  Had URI illness 6-7 weeks ago.  Lost smell and taste.  Overall improved, but ears still feel clogged up, still can't smell or taste.  Has had some intermittent left ear discomfort since 7 weeks ago.   Gynecological history:= 1 pregnancies, 1 live birth.  Diabetes-compliant with Trulicity weekly, Tresiba 41 units daily  Hypertension-compliant with telmisartan HCT  Hyperlipidemia-compliant with rosuvastatin  Reviewed their medical, surgical, family, social, medication, and allergy history and updated chart as appropriate.  Past Medical History:  Diagnosis Date   Allergy    Chronic back pain    Diabetes mellitus without complication (HCC)    Hyperlipidemia    Hypertension     Family History  Problem Relation Age of Onset   Hypertension Mother    Diabetes Mother    Heart disease Father 35       MI multiple, stents, CAD   Kidney disease Father    Hypertension Father    Diabetes Father    Cancer Maternal Aunt        colon   Cancer Paternal Aunt        breast   Cancer Paternal Grandmother        breast     Current Outpatient Medications:    cetirizine (ZYRTEC) 10 MG tablet, Take 10 mg by mouth daily., Disp: , Rfl:    insulin degludec (TRESIBA FLEXTOUCH) 100 UNIT/ML FlexTouch Pen, Inject 41 Units into the skin daily at 10 pm., Disp: 3 mL,  Rfl: 0   Multiple Vitamin (MULTIVITAMIN) tablet, Take 1 tablet by mouth daily., Disp: , Rfl:    rosuvastatin (CRESTOR) 10 MG tablet, Take 1 tablet (10 mg total) by mouth daily. rosuvastatin 10 mg tablet, Disp: 90 tablet, Rfl: 1   telmisartan-hydrochlorothiazide (MICARDIS HCT) 80-12.5 MG tablet, Take 1 tablet by mouth daily. telmisartan 80 mg-hydrochlorothiazide 12.5 mg tablet, Disp: 90 tablet, Rfl: 3   TRULICITY 4.5 MG/0.5ML SOPN, SMARTSIG:1 SUB-Q Once a Week, Disp: 2 mL, Rfl: 5  Allergies  Allergen Reactions   Gabapentin     Crying spells and head fuzzy feeling   Metformin And Related     Nausea and vomiting within 30 min of eating   Penicillins Swelling    Has patient had a PCN reaction causing immediate rash, facial/tongue/throat swelling, SOB or lightheadedness with hypotension:YES Has patient had a PCN reaction causing severe rash involving mucus membranes or skin necrosis: NO Has patient had a PCN reaction that required hospitalization NO Has patient had a PCN reaction occurring within the last 10 years: NO If all of the above answers are "NO", then may proceed with Cephalosporin use.     Review of Systems Constitutional: -fever, -chills, -sweats, -unexpected weight change, -decreased appetite, -fatigue Allergy: -sneezing, -itching, -congestion Dermatology: -changing moles, --rash, -lumps ENT: -runny nose, -  ear pain, -sore throat, -hoarseness, -sinus pain, -teeth pain, - ringing in ears, -hearing loss, -nosebleeds Cardiology: -chest pain, -palpitations, -swelling, -difficulty breathing when lying flat, -waking up short of breath Respiratory: -cough, -shortness of breath, -difficulty breathing with exercise or exertion, -wheezing, -coughing up blood Gastroenterology: -abdominal pain, -nausea, -vomiting, -diarrhea, -constipation, -blood in stool, -changes in bowel movement, -difficulty swallowing or eating Hematology: -bleeding, -bruising  Musculoskeletal: -joint aches, -muscle  aches, -joint swelling, -back pain, -neck pain, -cramping, -changes in gait Ophthalmology: denies vision changes, eye redness, itching, discharge Urology: -burning with urination, -difficulty urinating, -blood in urine, -urinary frequency, -urgency, -incontinence Neurology: -headache, -weakness, -tingling, -numbness, -memory loss, -falls, -dizziness Psychology: -depressed mood, -agitation, -sleep problems Breast/gyn: -breast tendnerss, -discharge, -lumps, -vaginal discharge,- irregular periods, -heavy periods   Depression screen [facility] 2/9 11/07/2021 06/24/2021 12/27/2020  Decreased Interest 0 0 0  Down, Depressed, Hopeless 0 0 0  PHQ - 2 Score 0 0 0       Objective:  BP 110/70    Pulse 80    Ht 5' 4.5" (1.638 m)    Wt 201 lb (91.2 kg)    BMI 33.97 kg/m   Wt Readings from Last 3 Encounters:  11/07/21 201 lb (91.2 kg)  06/24/21 206 lb 9.6 oz (93.7 kg)  12/27/20 213 lb 6.4 oz (96.8 kg)   BP Readings from Last 3 Encounters:  11/07/21 110/70  06/24/21 130/80  12/27/20 (!) 144/90    General appearance: alert, no distress, WD/WN, Caucasian female Skin: scattered skin tags of left and anterior neck, scattered macules, no worrisome lesions HEENT: normocephalic, conjunctiva/corneas normal, sclerae anicteric, PERRLA, EOMi, nares patent, no discharge or erythema, pharynx normal Oral cavity: MMM, tongue normal, teeth in good repair Neck: supple, no lymphadenopathy, no thyromegaly, no masses, normal ROM, no bruits Chest: non tender, normal shape and expansion Heart: RRR, normal S1, S2, no murmurs Lungs: CTA bilaterally, no wheezes, rhonchi, or rales Abdomen: +bs, soft, non tender, non distended, no masses, no hepatomegaly, no splenomegaly, no bruits Back: non tender, normal ROM, no scoliosis Musculoskeletal: upper extremities non tender, no obvious deformity, normal ROM throughout, lower extremities non tender, no obvious deformity, normal ROM throughout Extremities: no edema, no cyanosis, no  clubbing Pulses: 2+ symmetric, upper and lower extremities, normal cap refill Neurological: alert, oriented x 3, CN2-12 intact, strength normal upper extremities and lower extremities, sensation normal throughout, DTRs 2+ throughout, no cerebellar signs, gait normal Psychiatric: normal affect, behavior normal, pleasant  Breast/gyn/rectal - deferred/declined  Diabetic Foot Exam - Simple   Simple Foot Form Diabetic Foot exam was performed with the following findings: Yes 11/07/2021  8:59 AM  Visual Inspection See comments: Yes Sensation Testing Intact to touch and monofilament testing bilaterally: Yes Pulse Check Posterior Tibialis and Dorsalis pulse intact bilaterally: Yes Comments Yellowing of both great toenails    EKG Indication, screening for heart disease, risk factors include diabetes, family history of premature CAD, rate 75 bpm, PR 152 ms, QRS 132 ms, QTc 482 ms, axis 48 degrees, right bundle branch block, abnormal EKG, no prior to compare.   Assessment and Plan :   Encounter Diagnoses  Name Primary?   Encounter for health maintenance examination in adult Yes   Essential hypertension, benign    Hyperlipidemia associated with type 2 diabetes mellitus (HCC)    Uncontrolled type 2 diabetes mellitus with hyperglycemia (HCC)    Vaccine counseling    Estrogen deficiency    Surgical menopause    Family history of osteoporotic fracture in  parent    Need for hepatitis C screening test    Screening for heart disease    Family history of premature CAD    Abnormal EKG      This visit was a preventative care visit, also known as wellness visit or routine physical.   Topics typically include healthy lifestyle, diet, exercise, preventative care, vaccinations, sick and well care, proper use of emergency dept and after hours care, as well as other concerns.     Recommendations: Continue to return yearly for your annual wellness and preventative care visits.  This gives [place] a chance  to discuss healthy lifestyle, exercise, vaccinations, review your chart record, and perform screenings where appropriate.  I recommend you see your eye doctor yearly for routine vision care.  I recommend you see your dentist yearly for routine dental care including hygiene visits twice yearly.   Vaccination recommendations were reviewed Immunization History  Administered Date(s) Administered   Influenza,inj,Quad PF,6+ Mos 06/24/2021   PFIZER(Purple Top)SARS-COV-2 Vaccination 11/30/2019, 12/21/2019, 09/05/2020   PNEUMOCOCCAL CONJUGATE-20 12/31/2020   Tdap 06/06/2021   Zoster Recombinat (Shingrix) 09/22/2014, 06/06/2021   She notes recent pneumococcal vaccine at [facility] few months ago . We will try and get info on this.  Likely had covid recently given symptoms, likely developing antibodies instead of getting booster vaccine today.   Screening for cancer: Colon cancer screening: colonoscopy pending 11/2021.  Breast cancer screening: You should perform a self breast exam monthly.   We reviewed recommendations for regular mammograms and breast cancer screening.  Reviewed 10/2021 normal mammogram  Cervical cancer screening: We reviewed recommendations for pap smear screening.  Have had prior hysterectomy, no longer needs pap smear.   Skin cancer screening: Check your skin regularly for new changes, growing lesions, or other lesions of concern Come in for evaluation if you have skin lesions of concern.  Lung cancer screening: If you have a greater than 20 pack year history of tobacco use, then you may qualify for lung cancer screening with a chest CT scan.   Please call your insurance company to inquire about coverage for this test.  We currently don't have screenings for other cancers besides breast, cervical, colon, and lung cancers.  If you have a strong family history of cancer or have other cancer screening concerns, please let me know.    Bone health: Get at least 150  minutes of aerobic exercise weekly Get weight bearing exercise at least once weekly Bone density test:  A bone density test is an imaging test that uses a type of X-ray to measure the amount of calcium and other minerals in your bones. The test may be used to diagnose or screen you for a condition that causes weak or thin bones (osteoporosis), predict your risk for a broken bone (fracture), or determine how well your osteoporosis treatment is working. The bone density test is recommended for females 65 and older, or females or males <65 if certain risk factors such as thyroid disease, long term use of steroids such as for asthma or rheumatological issues, vitamin D deficiency, estrogen deficiency, family history of osteoporosis, self or family history of fragility fracture in first degree relative.  Consider baseline bone density test given s/p total hysterectomy and mother's history of fractures as an adult.     Please call to schedule your bone density test.   [facility]  [phone] [address] [place], [place] [address]   Heart health: Get  at least 150 minutes of aerobic exercise weekly Limit alcohol It is important to maintain a healthy blood pressure and healthy cholesterol numbers  Heart disease screening: Screening for heart disease includes screening for blood pressure, fasting lipids, glucose/diabetes screening, BMI height to weight ratio, reviewed of smoking status, physical activity, and diet.    Goals include blood pressure 120/80 or less, maintaining a healthy lipid/cholesterol profile, preventing diabetes or keeping diabetes numbers under good control, not smoking or using tobacco products, exercising most days per week or at least 150 minutes per week of exercise, and eating healthy variety of fruits and vegetables, healthy oils, and avoiding unhealthy food choices like fried food, fast food, high sugar and high cholesterol foods.     Other tests may possibly include EKG test, CT coronary calcium score, echocardiogram, exercise treadmill stress test.   Consider CT coronary calcium score    Medical care options: I recommend you continue to seek care here first for routine care.  We try really hard to have available appointments Monday through Friday daytime hours for sick visits, acute visits, and physicals.  Urgent care should be used for after hours and weekends for significant issues that cannot wait till the next day.  The emergency department should be used for significant potentially life-threatening emergencies.  The emergency department is expensive, can often have long wait times for less significant concerns, so try to utilize primary care, urgent care, or telemedicine when possible to avoid unnecessary trips to the emergency department.  Virtual visits and telemedicine have been introduced since the pandemic started in 2020, and can be convenient ways to receive medical care.  We offer virtual appointments as well to assist you in a variety of options to seek medical care.    Advanced Directives: I recommend you consider completing a Health Care Power of Attorney and Living Will.   These documents respect your wishes and help alleviate burdens on your loved ones if you were to become terminally ill or be in a position to need those documents enforced.    You can complete Advanced Directives yourself, have them notarized, then have copies made for our office, for you and for anybody you feel should have them in safe keeping.  Or, you can have an attorney prepare these documents.   If you haven't updated your Last Will and Testament in a while, it may be worthwhile having an attorney prepare these documents together and save on some costs.       Separate significant issues discussed: Diabetes - uncontrolled. Updated labs today.  Continue daily foot checks, yearly eye doctor visits.   Hypertension - compliant  with medicaiton  Hyperlipidemia - compliant with statin  Recent URI, likely covid infection.   Still has some congestion, still has loss of smell and taste  Baseline screening EKG abnormal.  Given risk factors, consider referral to cardiology   Audrey King was seen today for fasting cpe.  Diagnoses and all orders for this visit:  Encounter for health maintenance examination in adult -     Comprehensive metabolic panel -     CBC -     Hemoglobin A1c -     DG Bone Density; Future -     HIV Antibody (routine testing w rflx) -     Hepatitis C antibody -     TSH -     VITAMIN D 25 Hydroxy (Vit-D Deficiency, Fractures) -     EKG 12-Lead  Essential hypertension, benign -  Comprehensive metabolic panel -     EKG 12-Lead -     Ambulatory referral to Cardiology  Hyperlipidemia associated with type 2 diabetes mellitus (HCC)  Uncontrolled type 2 diabetes mellitus with hyperglycemia (HCC) -     Hemoglobin A1c  Vaccine counseling  Estrogen deficiency -     DG Bone Density; Future  Surgical menopause -     DG Bone Density; Future  Family history of osteoporotic fracture in parent -     DG Bone Density; Future  Need for hepatitis C screening test -     Hepatitis C antibody  Screening for heart disease -     EKG 12-Lead  Family history of premature CAD -     Ambulatory referral to Cardiology  Abnormal EKG -     Ambulatory referral to Cardiology   Follow-up pending labs, yearly for physical

## 2021-11-08 ENCOUNTER — Other Ambulatory Visit: Payer: Self-pay | Admitting: Medical

## 2021-11-08 ENCOUNTER — Other Ambulatory Visit: Payer: Self-pay | Admitting: Internal Medicine

## 2021-11-08 DIAGNOSIS — E1165 Type 2 diabetes mellitus with hyperglycemia: Secondary | ICD-10-CM

## 2021-11-08 LAB — COMPREHENSIVE METABOLIC PANEL
ALT: 31 IU/L (ref 0–32)
AST: 31 IU/L (ref 0–40)
Albumin/Globulin Ratio: 2.4 — ABNORMAL HIGH (ref 1.2–2.2)
Albumin: 4.8 g/dL (ref 3.8–4.9)
Alkaline Phosphatase: 51 IU/L (ref 44–121)
BUN/Creatinine Ratio: 33 — ABNORMAL HIGH (ref 9–23)
BUN: 22 mg/dL (ref 6–24)
Bilirubin Total: 1.3 mg/dL — ABNORMAL HIGH (ref 0.0–1.2)
CO2: 24 mmol/L (ref 20–29)
Calcium: 10.4 mg/dL — ABNORMAL HIGH (ref 8.7–10.2)
Chloride: 97 mmol/L (ref 96–106)
Creatinine, Ser: 0.66 mg/dL (ref 0.57–1.00)
Globulin, Total: 2 g/dL (ref 1.5–4.5)
Glucose: 258 mg/dL — ABNORMAL HIGH (ref 70–99)
Potassium: 4.3 mmol/L (ref 3.5–5.2)
Sodium: 134 mmol/L (ref 134–144)
Total Protein: 6.8 g/dL (ref 6.0–8.5)
eGFR: 103 mL/min/{1.73_m2} (ref >59)

## 2021-11-08 LAB — CBC
Hematocrit: 42.3 % (ref 34.0–46.6)
Hemoglobin: 14.4 g/dL (ref 11.1–15.9)
MCH: 30.9 pg (ref 26.6–33.0)
MCHC: 34 g/dL (ref 31.5–35.7)
MCV: 91 fL (ref 79–97)
Platelets: 183 10*3/uL (ref 150–450)
RBC: 4.66 x10E6/uL (ref 3.77–5.28)
RDW: 13.1 % (ref 11.7–15.4)
WBC: 7.4 10*3/uL (ref 3.4–10.8)

## 2021-11-08 LAB — VITAMIN D 25 HYDROXY (VIT D DEFICIENCY, FRACTURES): Vit D, 25-Hydroxy: 9.8 ng/mL — ABNORMAL LOW (ref 30.0–100.0)

## 2021-11-08 LAB — HEMOGLOBIN A1C
Est. average glucose Bld gHb Est-mCnc: 252 mg/dL
Hgb A1c MFr Bld: 10.4 % — ABNORMAL HIGH (ref 4.8–5.6)

## 2021-11-08 LAB — HIV ANTIBODY (ROUTINE TESTING W REFLEX): HIV Screen 4th Generation wRfx: NONREACTIVE

## 2021-11-08 LAB — HEPATITIS C ANTIBODY: Hep C Virus Ab: NONREACTIVE

## 2021-11-08 LAB — TSH: TSH: 1.31 u[IU]/mL (ref 0.450–4.500)

## 2021-11-08 MED ORDER — TRESIBA FLEXTOUCH 100 UNIT/ML ~~LOC~~ SOPN: 50.0000 [IU] | PEN_INJECTOR | Freq: Every day | SUBCUTANEOUS | 2 refills | Status: DC

## 2021-11-08 MED ORDER — ROSUVASTATIN CALCIUM 10 MG PO TABS: 10.0000 mg | ORAL_TABLET | Freq: Every day | ORAL | 3 refills | Status: DC

## 2021-11-08 MED ORDER — TRULICITY 4.5 MG/0.5ML ~~LOC~~ SOAJ: SUBCUTANEOUS | 5 refills | Status: DC

## 2021-11-20 ENCOUNTER — Encounter: Payer: Self-pay | Admitting: Internal Medicine

## 2021-11-20 LAB — HM COLONOSCOPY

## 2021-11-21 ENCOUNTER — Other Ambulatory Visit: Payer: Self-pay | Admitting: Medical

## 2021-11-21 DIAGNOSIS — Z0000 Encounter for general adult medical examination without abnormal findings: Secondary | ICD-10-CM

## 2021-11-21 DIAGNOSIS — E2839 Other primary ovarian failure: Secondary | ICD-10-CM

## 2021-11-21 DIAGNOSIS — E8940 Asymptomatic postprocedural ovarian failure: Secondary | ICD-10-CM

## 2021-11-21 DIAGNOSIS — Z8269 Family history of other diseases of the musculoskeletal system and connective tissue: Secondary | ICD-10-CM

## 2021-11-28 ENCOUNTER — Encounter: Payer: Self-pay | Admitting: Medical

## 2021-12-09 ENCOUNTER — Ambulatory Visit (INDEPENDENT_AMBULATORY_CARE_PROVIDER_SITE_OTHER): Payer: Commercial Managed Care - PPO | Admitting: Internal Medicine

## 2021-12-09 ENCOUNTER — Other Ambulatory Visit: Payer: Self-pay

## 2021-12-09 ENCOUNTER — Encounter: Payer: Self-pay | Admitting: Internal Medicine

## 2021-12-09 VITALS — BP 108/72 | HR 89 | Ht 63.0 in | Wt 200.0 lb

## 2021-12-09 DIAGNOSIS — R5383 Other fatigue: Secondary | ICD-10-CM

## 2021-12-09 DIAGNOSIS — E1165 Type 2 diabetes mellitus with hyperglycemia: Secondary | ICD-10-CM

## 2021-12-09 DIAGNOSIS — I10 Essential (primary) hypertension: Secondary | ICD-10-CM | POA: Diagnosis not present

## 2021-12-09 DIAGNOSIS — Z01812 Encounter for preprocedural laboratory examination: Secondary | ICD-10-CM | POA: Diagnosis not present

## 2021-12-09 DIAGNOSIS — R9431 Abnormal electrocardiogram [ECG] [EKG]: Secondary | ICD-10-CM

## 2021-12-09 DIAGNOSIS — I4510 Unspecified right bundle-branch block: Secondary | ICD-10-CM

## 2021-12-09 DIAGNOSIS — E1169 Type 2 diabetes mellitus with other specified complication: Secondary | ICD-10-CM

## 2021-12-09 DIAGNOSIS — Z8249 Family history of ischemic heart disease and other diseases of the circulatory system: Secondary | ICD-10-CM

## 2021-12-09 DIAGNOSIS — E785 Hyperlipidemia, unspecified: Secondary | ICD-10-CM

## 2021-12-09 MED ORDER — METOPROLOL TARTRATE 100 MG PO TABS: 100.0000 mg | ORAL_TABLET | Freq: Once | ORAL | 0 refills | Status: DC

## 2021-12-09 NOTE — Patient Instructions (Signed)
Medication Instructions:  ?NO CHANGES ?*If you need a refill on your cardiac medications before your next appointment, please call your pharmacy* ? ? ?Lab Work: ?Non-Fasting BMET prior to CT test to check kidney function  ? ?If you have labs (blood work) drawn today and your tests are completely normal, you will receive your results only by: ?MyChart Message (if you have MyChart) OR ?A paper copy in the mail ?If you have any lab test that is abnormal or we need to change your treatment, we will call you to review the results. ? ? ?Testing/Procedures: ?Coronary CT at [facility] ?Once approved with insurance, you will be called to schedule ? ?Home Sleep Test ?Once approved with insurance, our sleep coordinator Taite will be in touch ? ? ?Follow-Up: ?At [facility], you and your health needs are our priority.  As part of our continuing mission to provide you with exceptional heart care, we have created designated Provider Care Teams.  These Care Teams include your primary Cardiologist (physician) and Advanced Practice Providers (APPs -  Physician Assistants and Nurse Practitioners) who all work together to provide you with the care you need, when you need it. ? ?We recommend signing up for the patient portal called "MyChart".  Sign up information is provided on this After Visit Summary.  MyChart is used to connect with patients for Virtual Visits (Telemedicine).  Patients are able to view lab/test results, encounter notes, upcoming appointments, etc.  Non-urgent messages can be sent to your provider as well.   ?To learn more about what you can do with MyChart, go to [email or web address].   ? ?Your next appointment:   ?1-2 months (after CT) ?OK to schedule on DOD day  ? ?Other Instructions ? ? ?Your cardiac CT will be scheduled at one of the below locations:  ? ?[facility] ?[address] ?[place], [place] [address] ?[phone] ? ?OR ? ?[facility] ?[address] ?[address] ?[place], [place] [address] ?[phone] ? ?If scheduled at [facility], please arrive at the [facility] and Children's Entrance (Entrance C2) of [facility] 30 minutes prior to test start time. ?You can use the FREE valet parking offered at entrance C (encouraged to control the heart rate for the test)  ?Proceed to the [facility] Radiology Department (first floor) to check-in and test prep. ? ?All radiology patients and guests should use entrance C2 at [facility], accessed from [facility], even though the hospital's physical address listed is [address]. ? ? ? ?If scheduled at [facility], please arrive 15 mins early for check-in and test prep. ? ?Please follow these instructions carefully (unless otherwise directed): ? ?Hold all erectile dysfunction medications at least 3 days (72 hrs) prior to test. ? ?On the Night Before the Test: ?Be sure to Drink plenty of water. ?Do not consume any caffeinated/decaffeinated beverages or chocolate 12 hours prior to your test. ?Do not take any antihistamines 12 hours prior to your test. ? ?On the Day of the Test: ?Drink plenty of water until 1 hour prior to the test. ?Do not eat any food 4 hours prior to the test. ?You may take your regular medications prior to the test.  ?Take metoprolol (Lopressor) two hours prior to test. ?HOLD BP medication (telmisartan-hct) the DAY of procedure ?FEMALES- please wear underwire-free bra if available, avoid dresses & tight clothing ?     ?After the Test: ?Drink plenty of  water. ?After receiving IV contrast, you may experience a mild flushed feeling. This is normal. ?On occasion, you may experience a mild rash up to 24 hours after the test. This is not dangerous. If this occurs, you can take Benadryl 25 mg and increase your fluid intake. ?If you experience trouble breathing, this can be serious. If it is severe call 911 IMMEDIATELY. If it is mild, please  call our office. ?If you take any of these medications: Glipizide/Metformin, Avandament, Glucavance, please do not take 48 hours after completing test unless otherwise instructed. ? ?We will call to schedule your test 2-4 weeks out understanding that some insurance companies will need an authorization prior to the service being performed.  ? ?For non-scheduling related questions, please contact the cardiac imaging nurse navigator should you have any questions/concerns: ?Rockwell Alexandria, Cardiac Imaging Nurse Navigator ?Larey Brick, Cardiac Imaging Nurse Navigator ?[facility] ?Direct Office Dial: [phone]  ? ?For scheduling needs, including cancellations and rescheduling, please call [place], [phone]. ? ? ?

## 2021-12-09 NOTE — Progress Notes (Signed)
? ?OFFICE CONSULT NOTE ? ?Chief Complaint:  ?Abnormal EKG ? ?Primary Care Physician: ?Tysinger, Camelia Eng, PA-C ? ?HPI:  ?Audrey King is a 56 y.o. female who is being seen today for the evaluation of abnormal EKG at the request of Caryl Ada.  This is a pleasant 56 year old female kindly referred for evaluation management of an abnormal EKG.  She recently established with a new PCP and an EKG was performed.  This demonstrated normal sinus rhythm with a right bundle branch block at 89.  Other risk factors for heart disease include type 2 diabetes, hypertension and dyslipidemia.  I was informed that she had heart disease in her family including her father apparently had his first heart attack in his 20s.  Since then he has had numerous stents and other cardiac conditions.  With the EKG demonstrating a right bundle branch block, 1 typically thinks about pulmonary pathology such as COPD, obstructive sleep apnea or pulmonary hypertension.  She does have risk factors for sleep apnea and after performing a questionnaire today her Epworth Sleepiness Scale score was 10.  Weight is 200 pounds with a BMI of 35 and neck thickness greater than 17 inches.  Blood pressure was noted to be somewhat low normal which she says is typical for her and she is on medication for this last lipid profile showed total cholesterol 148, triglycerides 116 HDL 41 and LDL 86.  She does report fatigue and does not note any shortness of breath or chest pain with exertion.  Diabetes has been uncontrolled with recent hemoglobin A1c of 10.4%. ? ?PMHx:  ?Past Medical History:  ?Diagnosis Date  ? Allergy   ? Chronic back pain   ? Diabetes mellitus without complication ([place])   ? Hyperlipidemia   ? Hypertension   ? ? ?Past Surgical History:  ?Procedure Laterality Date  ? CESAREAN SECTION    ? TOTAL ABDOMINAL HYSTERECTOMY W/ BILATERAL SALPINGOOPHORECTOMY  2010  ? ? ?FAMHx:  ?Family History  ?Problem Relation Age of Onset  ? Hypertension  Mother   ? Diabetes Mother   ? Heart disease Father 55  ?     MI multiple, stents, CAD  ? Kidney disease Father   ? Hypertension Father   ? Diabetes Father   ? Cancer Maternal Aunt   ?     colon  ? Cancer Paternal Aunt   ?     breast  ? Cancer Paternal Grandmother   ?     breast  ? ? ?SOCHx:  ? reports that she has never smoked. She has never used smokeless tobacco. She reports that she does not drink alcohol and does not use drugs. ? ?ALLERGIES:  ?Allergies  ?Allergen Reactions  ? Gabapentin   ?  Crying spells and head fuzzy feeling  ? Metformin And Related   ?  Nausea and vomiting within 30 min of eating  ? Penicillins Swelling  ?  Has patient had a PCN reaction causing immediate rash, facial/tongue/throat swelling, SOB or lightheadedness with hypotension:YES ?Has patient had a PCN reaction causing severe rash involving mucus membranes or skin necrosis: NO ?Has patient had a PCN reaction that required hospitalization NO ?Has patient had a PCN reaction occurring within the last 10 years: NO ?If all of the above answers are "NO", then may proceed with Cephalosporin use.  ? ? ?ROS: ?Pertinent items noted in HPI and remainder of comprehensive ROS otherwise negative. ? ?HOME MEDS: ?Current Outpatient Medications on File Prior to Visit  ?  Medication Sig Dispense Refill  ? cetirizine (ZYRTEC) 10 MG tablet Take 10 mg by mouth daily.    ? insulin degludec (TRESIBA FLEXTOUCH) 100 UNIT/ML FlexTouch Pen Inject 50 Units into the skin daily at 10 pm. 3 mL 2  ? Multiple Vitamin (MULTIVITAMIN) tablet Take 1 tablet by mouth daily.    ? rosuvastatin (CRESTOR) 10 MG tablet Take 1 tablet (10 mg total) by mouth daily. rosuvastatin 10 mg tablet 90 tablet 3  ? telmisartan-hydrochlorothiazide (MICARDIS HCT) 80-12.5 MG tablet Take 1 tablet by mouth daily. telmisartan 80 mg-hydrochlorothiazide 12.5 mg tablet 90 tablet 3  ? TRULICITY 4.5 0000000 SOPN SMARTSIG:1 SUB-Q Once a Week 2 mL 5  ? ?No current facility-administered medications on  file prior to visit.  ? ? ?LABS/IMAGING: ?No results found for this or any previous visit (from the past 48 hour(s)). ?No results found. ? ?LIPID PANEL: ?   ?Component Value Date/Time  ? CHOL 148 06/24/2021 1125  ? TRIG 116 06/24/2021 1125  ? HDL 41 06/24/2021 1125  ? CHOLHDL 3.6 06/24/2021 1125  ? [place] 86 06/24/2021 1125  ? ? ?WEIGHTS: ?Wt Readings from Last 3 Encounters:  ?12/09/21 200 lb (90.7 kg)  ?11/07/21 201 lb (91.2 kg)  ?06/24/21 206 lb 9.6 oz (93.7 kg)  ? ? ?VITALS: ?BP 108/72   Pulse 89   Ht 5\' 3"  (1.6 m)   Wt 200 lb (90.7 kg)   SpO2 99%   BMI 35.43 kg/m?  ? ?EXAM: ?General appearance: alert, no distress, and moderately obese ?Neck: no carotid bruit, no JVD, and thyroid not enlarged, symmetric, no tenderness/mass/nodules ?Lungs: clear to auscultation bilaterally ?Heart: regular rate and rhythm, S1, S2 normal, no murmur, click, rub or gallop ?Abdomen: soft, non-tender; bowel sounds normal; no masses,  no organomegaly ?Extremities: extremities normal, atraumatic, no cyanosis or edema ?Pulses: 2+ and symmetric ?Skin: Skin color, texture, turgor normal. No rashes or lesions ?Neurologic: Grossly normal ?Psych: Pleasant ? ?*Examination chaperoned by Sheral Apley, RN. ? ?EKG: ?Normal sinus rhythm at 89, RBBB- personally reviewed ? ?ASSESSMENT: ?Fatigue and abnormal EKG ?Type 2 diabetes-uncontrolled, A1c 10.4% ?Hypertension ?Dyslipidemia ?Family history of premature coronary disease in her father ?At risk for OSA, EPWSS of 10 ? ?PLAN: ?1.   Audrey King has had fatigue and is noted to have abnormal EKG with a right bundle branch block.  I am concerned with her family history of early onset heart disease and risk factors this could represent underlying coronary disease.  Would recommend a CT coronary angiogram to further evaluate this.  She will likely need 100 mg of metoprolol as her heart rate is in the upper 80s, however with her low blood pressure, though I would advise not taking her blood pressure  medicine on the day of the procedure to allow room for the metoprolol and nitrate during the CT scan.  Ultimately, I am also concerned about possible sleep apnea as a cause of her right bundle and RV changes and therefore would recommend a home sleep study.  We will arrange this.  Plan follow up with me afterwards to discuss these findings and further recommendations of therapy including possibly increasing her Crestor further due to her target LDL cholesterol of 70 or lower as a diabetic. ? ?Thanks again for the kind referral. ? ?Pixie Casino, MD, [facility], FACP  ?[place]  ?Medical Director of the Advanced Lipid Disorders &  ?Cardiovascular Risk Reduction Clinic ?Diplomate of the [employer] of Clinical Lipidology ?Attending Cardiologist  ?  Direct Dial: [phone]  Fax: [phone]  ?Website:  [email or web address] ? ?Nadean Corwin Evelina Lore ?12/09/2021, 4:48 PM ?

## 2021-12-16 ENCOUNTER — Ambulatory Visit (HOSPITAL_BASED_OUTPATIENT_CLINIC_OR_DEPARTMENT_OTHER): Payer: Commercial Managed Care - PPO | Attending: Internal Medicine | Admitting: Cardiovascular Disease

## 2021-12-19 LAB — BASIC METABOLIC PANEL
BUN/Creatinine Ratio: 32 — ABNORMAL HIGH (ref 9–23)
BUN: 22 mg/dL (ref 6–24)
CO2: 29 mmol/L (ref 20–29)
Calcium: 10.1 mg/dL (ref 8.7–10.2)
Chloride: 98 mmol/L (ref 96–106)
Creatinine, Ser: 0.69 mg/dL (ref 0.57–1.00)
Glucose: 336 mg/dL — ABNORMAL HIGH (ref 70–99)
Potassium: 4.3 mmol/L (ref 3.5–5.2)
Sodium: 138 mmol/L (ref 134–144)
eGFR: 102 mL/min/{1.73_m2} (ref >59)

## 2021-12-23 ENCOUNTER — Telehealth (HOSPITAL_COMMUNITY): Payer: Self-pay | Admitting: *Deleted

## 2021-12-23 NOTE — Telephone Encounter (Signed)
Patient calling regarding upcoming cardiac imaging study; pt verbalizes understanding of appt date/time, parking situation and where to check in, pre-test NPO status and medications ordered, and verified current allergies; name and call back number provided for further questions should they arise ? ?Larey Brick RN Navigator Cardiac Imaging ?[facility] ?[phone] office ?[phone] cell ? ?Patient to hold her BP medication but will take 100mg  metoprolol tartrate two hours prior to her cardiac CT scan.  She is aware to arrive at 7:15am. ?

## 2021-12-24 ENCOUNTER — Ambulatory Visit (HOSPITAL_BASED_OUTPATIENT_CLINIC_OR_DEPARTMENT_OTHER)
Admission: RE | Admit: 2021-12-24 | Discharge: 2021-12-24 | Disposition: A | Discharge status: Home / Self Care | Payer: Commercial Managed Care - PPO | Source: Ambulatory Visit | Attending: Cardiology | Admitting: Cardiology

## 2021-12-24 ENCOUNTER — Other Ambulatory Visit: Payer: Self-pay | Admitting: Cardiology

## 2021-12-24 ENCOUNTER — Ambulatory Visit (HOSPITAL_COMMUNITY)
Admission: RE | Admit: 2021-12-24 | Discharge: 2021-12-24 | Disposition: A | Discharge status: Home / Self Care | Payer: Commercial Managed Care - PPO | Source: Ambulatory Visit | Attending: Cardiology | Admitting: Cardiology

## 2021-12-24 ENCOUNTER — Ambulatory Visit (HOSPITAL_COMMUNITY)
Admission: RE | Admit: 2021-12-24 | Discharge: 2021-12-24 | Disposition: A | Discharge status: Home / Self Care | Payer: Commercial Managed Care - PPO | Source: Ambulatory Visit | Attending: Internal Medicine | Admitting: Internal Medicine

## 2021-12-24 ENCOUNTER — Other Ambulatory Visit (HOSPITAL_BASED_OUTPATIENT_CLINIC_OR_DEPARTMENT_OTHER): Payer: Self-pay | Admitting: Cardiology

## 2021-12-24 DIAGNOSIS — Z8249 Family history of ischemic heart disease and other diseases of the circulatory system: Secondary | ICD-10-CM | POA: Diagnosis not present

## 2021-12-24 DIAGNOSIS — I700 Atherosclerosis of aorta: Secondary | ICD-10-CM | POA: Diagnosis not present

## 2021-12-24 DIAGNOSIS — R931 Abnormal findings on diagnostic imaging of heart and coronary circulation: Secondary | ICD-10-CM | POA: Insufficient documentation

## 2021-12-24 DIAGNOSIS — I2510 Atherosclerotic heart disease of native coronary artery without angina pectoris: Secondary | ICD-10-CM | POA: Insufficient documentation

## 2021-12-24 DIAGNOSIS — I4510 Unspecified right bundle-branch block: Secondary | ICD-10-CM | POA: Insufficient documentation

## 2021-12-24 DIAGNOSIS — E1165 Type 2 diabetes mellitus with hyperglycemia: Secondary | ICD-10-CM | POA: Insufficient documentation

## 2021-12-24 DIAGNOSIS — R9431 Abnormal electrocardiogram [ECG] [EKG]: Secondary | ICD-10-CM | POA: Insufficient documentation

## 2021-12-24 DIAGNOSIS — I10 Essential (primary) hypertension: Secondary | ICD-10-CM | POA: Insufficient documentation

## 2021-12-24 DIAGNOSIS — E1169 Type 2 diabetes mellitus with other specified complication: Secondary | ICD-10-CM | POA: Insufficient documentation

## 2021-12-24 DIAGNOSIS — E785 Hyperlipidemia, unspecified: Secondary | ICD-10-CM | POA: Diagnosis not present

## 2021-12-24 MED ORDER — NITROGLYCERIN 0.4 MG SL SUBL
SUBLINGUAL_TABLET | SUBLINGUAL | Status: AC
  Filled 2021-12-24: qty 2

## 2021-12-24 MED ORDER — IOHEXOL 350 MG/ML SOLN
100.0000 mL | Freq: Once | INTRAVENOUS | Status: AC | PRN
  Administered 2021-12-24: 100 mL via INTRAVENOUS

## 2021-12-24 MED ORDER — DILTIAZEM HCL 25 MG/5ML IV SOLN: 5.0000 mg | INTRAVENOUS | Status: DC | PRN

## 2021-12-24 MED ORDER — METOPROLOL TARTRATE 5 MG/5ML IV SOLN: 5.0000 mg | INTRAVENOUS | Status: DC | PRN

## 2021-12-24 MED ORDER — NITROGLYCERIN 0.4 MG SL SUBL
0.8000 mg | SUBLINGUAL_TABLET | Freq: Once | SUBLINGUAL | Status: AC
  Administered 2021-12-24: 0.8 mg via SUBLINGUAL

## 2021-12-24 MED ORDER — METOPROLOL TARTRATE 5 MG/5ML IV SOLN
INTRAVENOUS | Status: AC
  Administered 2021-12-24: 10 mg via INTRAVENOUS
  Filled 2021-12-24: qty 10

## 2021-12-26 ENCOUNTER — Other Ambulatory Visit: Payer: Self-pay | Admitting: *Deleted

## 2021-12-26 DIAGNOSIS — E785 Hyperlipidemia, unspecified: Secondary | ICD-10-CM

## 2021-12-26 MED ORDER — ROSUVASTATIN CALCIUM 40 MG PO TABS: 40.0000 mg | ORAL_TABLET | Freq: Every day | ORAL | 3 refills | Status: DC

## 2022-01-10 ENCOUNTER — Ambulatory Visit: Payer: Commercial Managed Care - PPO | Admitting: Dietician

## 2022-01-24 ENCOUNTER — Ambulatory Visit (HOSPITAL_BASED_OUTPATIENT_CLINIC_OR_DEPARTMENT_OTHER): Payer: Commercial Managed Care - PPO | Attending: Internal Medicine | Admitting: Cardiovascular Disease

## 2022-01-24 DIAGNOSIS — I4510 Unspecified right bundle-branch block: Secondary | ICD-10-CM | POA: Insufficient documentation

## 2022-01-24 DIAGNOSIS — I10 Essential (primary) hypertension: Secondary | ICD-10-CM | POA: Diagnosis not present

## 2022-01-24 DIAGNOSIS — R5383 Other fatigue: Secondary | ICD-10-CM | POA: Insufficient documentation

## 2022-01-24 DIAGNOSIS — G4733 Obstructive sleep apnea (adult) (pediatric): Secondary | ICD-10-CM | POA: Insufficient documentation

## 2022-02-01 ENCOUNTER — Encounter (HOSPITAL_BASED_OUTPATIENT_CLINIC_OR_DEPARTMENT_OTHER): Payer: Self-pay | Admitting: Cardiovascular Disease

## 2022-02-01 NOTE — Procedures (Signed)
? ? ? ? ?  Patient Name: Audrey King, Audrey King ?Study Date: 01/26/2022 ?Gender: Female ?D.O.B: [birth year] ?Age (years): 56 ?Referring Provider: Pixie Casino ?Height (inches): 64 ?Interpreting Physician: Shelva Majestic MD, ABSM ?Weight (lbs): 199 ?RPSGT: Jacolyn Reedy ?BMI: 34 ?MRN: [id number] ?Neck Size: 15.50 ? ?CLINICAL INFORMATION ?Sleep Study Type: HST ? ?Indication for sleep study: Fatigue, excessive daytime sleepiness, snoring ? ?Epworth Sleepiness Score: 12 ? ?SLEEP STUDY TECHNIQUE ?A multi-channel overnight portable sleep study was performed. The channels recorded were: nasal airflow, thoracic respiratory movement, and oxygen saturation with a pulse oximetry. Snoring was also monitored. ? ?MEDICATIONS ?Patient self administered medications include: N/A. ? ?SLEEP ARCHITECTURE ?Patient was studied for 364.5 minutes. The sleep efficiency was 100.0 % and the patient was supine for 71.4%. The arousal index was 0.0 per hour. ? ?RESPIRATORY PARAMETERS ?The overall AHI was 31.9 per hour, with a central apnea index of 0 per hour. There is a significant positional component with supine sleep AHI 40.6/h versus non-supine sleep AHI 10.4/h. ? ?The oxygen nadir was 87% during sleep. ? ?CARDIAC DATA ?Mean heart rate during sleep was 75.5 bpm. Heart rate range was 65 - 110 bpm. ? ?IMPRESSIONS ?- Severe obstructive sleep apnea occurred during this study (AHI 31.9/h); events were more severe with supine sleep (AHI 40.6/h). ?- Mild oxygen desaturation to a nadir of 87%. ?- Patient snored 4.9% during the sleep. ? ?DIAGNOSIS ?- Obstructive Sleep Apnea (G47.33) ? ?RECOMMENDATIONS ?- Therapeutic CPAP for treatment of the patient's severe sleep apnea. If unable to obtain an in-lab titrration, initiate Auto-PAP with EPR of 3 at 7 - 18 cm of water. ?- Effort should be made to optimize nasal and oropharyngeal patency. ?- Positional therapy avoiding supine position during sleep. ?- Avoid alcohol, sedatives and other CNS depressants  that may worsen sleep apnea and disrupt normal sleep architecture. ?- Sleep hygiene should be reviewed to assess factors that may improve sleep quality. ?- Weight management (BMI) and regular exercise should be initiated or continued. ?- Recommend a download and sleep clinic evaluation after one month of therapy. ?- ?[Electronically signed] 02/01/2022 09:35 AM ? ?Shelva Majestic MD, [facility], ABSM ?Diplomate, [employer] of Sleep Medicine ? ?NPI: [id number] ? ?[place] ?PH: [phone]   FX: [phone] ?ACCREDITED BY THE AMERICAN ACADEMY OF SLEEP MEDICINE ? ?

## 2022-02-04 LAB — LIPID PANEL
Chol/HDL Ratio: 2.8 ratio (ref 0.0–4.4)
Cholesterol, Total: 108 mg/dL (ref 100–199)
HDL: 39 mg/dL — ABNORMAL LOW (ref >39)
LDL Chol Calc (NIH): 52 mg/dL (ref 0–99)
Triglycerides: 85 mg/dL (ref 0–149)
VLDL Cholesterol Cal: 17 mg/dL (ref 5–40)

## 2022-02-10 ENCOUNTER — Ambulatory Visit (INDEPENDENT_AMBULATORY_CARE_PROVIDER_SITE_OTHER): Payer: Commercial Managed Care - PPO | Admitting: Internal Medicine

## 2022-02-10 ENCOUNTER — Encounter: Payer: Self-pay | Admitting: Internal Medicine

## 2022-02-10 VITALS — BP 120/78 | HR 95 | Ht 63.0 in | Wt 202.6 lb

## 2022-02-10 DIAGNOSIS — E1165 Type 2 diabetes mellitus with hyperglycemia: Secondary | ICD-10-CM | POA: Diagnosis not present

## 2022-02-10 DIAGNOSIS — I2510 Atherosclerotic heart disease of native coronary artery without angina pectoris: Secondary | ICD-10-CM

## 2022-02-10 DIAGNOSIS — G4733 Obstructive sleep apnea (adult) (pediatric): Secondary | ICD-10-CM

## 2022-02-10 DIAGNOSIS — I10 Essential (primary) hypertension: Secondary | ICD-10-CM | POA: Diagnosis not present

## 2022-02-10 DIAGNOSIS — E785 Hyperlipidemia, unspecified: Secondary | ICD-10-CM

## 2022-02-10 DIAGNOSIS — I4510 Unspecified right bundle-branch block: Secondary | ICD-10-CM

## 2022-02-10 NOTE — Patient Instructions (Signed)
Medication Instructions:  START aspirin 81mg  daily   *If you need a refill on your cardiac medications before your next appointment, please call your pharmacy*   Follow-Up: At [facility], you and your health needs are our priority.  As part of our continuing mission to provide you with exceptional heart care, we have created designated Provider Care Teams.  These Care Teams include your primary Cardiologist (physician) and Advanced Practice Providers (APPs -  Physician Assistants and Nurse Practitioners) who all work together to provide you with the care you need, when you need it.  We recommend signing up for the patient portal called "MyChart".  Sign up information is provided on this After Visit Summary.  MyChart is used to connect with patients for Virtual Visits (Telemedicine).  Patients are able to view lab/test results, encounter notes, upcoming appointments, etc.  Non-urgent messages can be sent to your provider as well.   To learn more about what you can do with MyChart, go to [facility].    Your next appointment:   6 months with Dr. [email or web address]  Other Instructions Our office sleep coordinator, Keesha, will be in touch about your results/plan

## 2022-02-10 NOTE — Progress Notes (Signed)
OFFICE CONSULT NOTE  Chief Complaint:  Follow-up abnormal EKG  Primary Care Physician: Jac Canavan, PA-C  HPI:  Audrey King is a 56 y.o. female who is being seen today for the evaluation of abnormal EKG at the request of Genia Del.  This is a pleasant 56 year old female kindly referred for evaluation management of an abnormal EKG.  She recently established with a new PCP and an EKG was performed.  This demonstrated normal sinus rhythm with a right bundle branch block at 89.  Other risk factors for heart disease include type 2 diabetes, hypertension and dyslipidemia.  I was informed that she had heart disease in her family including her father apparently had his first heart attack in his 30s.  Since then he has had numerous stents and other cardiac conditions.  With the EKG demonstrating a right bundle branch block, 1 typically thinks about pulmonary pathology such as COPD, obstructive sleep apnea or pulmonary hypertension.  She does have risk factors for sleep apnea and after performing a questionnaire today her Epworth Sleepiness Scale score was 10.  Weight is 200 pounds with a BMI of 35 and neck thickness greater than 17 inches.  Blood pressure was noted to be somewhat low normal which she says is typical for her and she is on medication for this last lipid profile showed total cholesterol 148, triglycerides 116 HDL 41 and LDL 86.  She does report fatigue and does not note any shortness of breath or chest pain with exertion.  Diabetes has been uncontrolled with recent hemoglobin A1c of 10.4%.  02/10/2022  Audrey King returns today for follow-up of her testing.  She underwent a sleep study that was interpreted by Dr. Daphene Jaeger, this indicated severe sleep apnea with an AHI of 31.9 events per hour, worse at 40.6/h in supine sleep.  Based on these findings, CPAP was recommended.  This is in progress.  She also underwent CT coronary angiography.  This demonstrated at least  moderate multivessel coronary disease and a calcium score 337, 98th percentile for age and sex matched control.  Aortic atherosclerosis and enlarged pulmonary artery were noted.  FFR was performed and was negative therefore no obstructive disease was suspected.  She returns today and says she is feeling fairly well.  She has tolerated increased dose of rosuvastatin now 40 mg daily.  Her total cholesterol is much lower at 108, HDL 39, triglycerides 85 and LDL 52.  This is at target.  Her blood pressure is also well controlled at 120/78.  She is working on lowering her A1c which was previously 10.4%.  She has a follow-up with a diabetes educator on June 12.  PMHx:  Past Medical History:  Diagnosis Date   Allergy    Chronic back pain    Diabetes mellitus without complication (HCC)    Hyperlipidemia    Hypertension     Past Surgical History:  Procedure Laterality Date   CESAREAN SECTION     TOTAL ABDOMINAL HYSTERECTOMY W/ BILATERAL SALPINGOOPHORECTOMY  2010    FAMHx:  Family History  Problem Relation Age of Onset   Hypertension Mother    Diabetes Mother    Heart disease Father 88       MI multiple, stents, CAD   Kidney disease Father    Hypertension Father    Diabetes Father    Cancer Maternal Aunt        colon   Cancer Paternal Aunt  breast   Cancer Paternal Grandmother        breast    SOCHx:   reports that she has never smoked. She has never used smokeless tobacco. She reports that she does not drink alcohol and does not use drugs.  ALLERGIES:  Allergies  Allergen Reactions   Gabapentin     Crying spells and head fuzzy feeling   Metformin And Related     Nausea and vomiting within 30 min of eating   Penicillins Swelling    Has patient had a PCN reaction causing immediate rash, facial/tongue/throat swelling, SOB or lightheadedness with hypotension:YES Has patient had a PCN reaction causing severe rash involving mucus membranes or skin necrosis: NO Has patient had  a PCN reaction that required hospitalization NO Has patient had a PCN reaction occurring within the last 10 years: NO If all of the above answers are "NO", then may proceed with Cephalosporin use.    ROS: Pertinent items noted in HPI and remainder of comprehensive ROS otherwise negative.  HOME MEDS: Current Outpatient Medications on File Prior to Visit  Medication Sig Dispense Refill   cetirizine (ZYRTEC) 10 MG tablet Take 10 mg by mouth daily.     insulin degludec (TRESIBA FLEXTOUCH) 100 UNIT/ML FlexTouch Pen Inject 50 Units into the skin daily at 10 pm. 3 mL 2   metoprolol tartrate (LOPRESSOR) 100 MG tablet Take 1 tablet (100 mg total) by mouth once for 1 dose. Take TWO HOURS prior to CT test. 1 tablet 0   Multiple Vitamin (MULTIVITAMIN) tablet Take 1 tablet by mouth daily.     rosuvastatin (CRESTOR) 40 MG tablet Take 1 tablet (40 mg total) by mouth daily. 90 tablet 3   telmisartan-hydrochlorothiazide (MICARDIS HCT) 80-12.5 MG tablet Take 1 tablet by mouth daily. telmisartan 80 mg-hydrochlorothiazide 12.5 mg tablet 90 tablet 3   TRULICITY 4.5 MG/0.5ML SOPN SMARTSIG:1 SUB-Q Once a Week 2 mL 5   No current facility-administered medications on file prior to visit.    LABS/IMAGING: No results found for this or any previous visit (from the past 48 hour(s)). No results found.  LIPID PANEL:    Component Value Date/Time   CHOL 108 02/04/2022 0821   TRIG 85 02/04/2022 0821   HDL 39 (L) 02/04/2022 0821   CHOLHDL 2.8 02/04/2022 0821   LDLCALC 52 02/04/2022 0821    WEIGHTS: Wt Readings from Last 3 Encounters:  02/10/22 202 lb 9.6 oz (91.9 kg)  12/09/21 200 lb (90.7 kg)  11/07/21 201 lb (91.2 kg)    VITALS: BP 120/78   Pulse 95   Ht 5\' 3"  (1.6 m)   Wt 202 lb 9.6 oz (91.9 kg)   SpO2 97%   BMI 35.89 kg/m   EXAM: General appearance: alert, no distress, and moderately obese Neck: no carotid bruit, no JVD, and thyroid not enlarged, symmetric, no tenderness/mass/nodules Lungs:  clear to auscultation bilaterally Heart: regular rate and rhythm, S1, S2 normal, no murmur, click, rub or gallop Abdomen: soft, non-tender; bowel sounds normal; no masses,  no organomegaly Extremities: extremities normal, atraumatic, no cyanosis or edema Pulses: 2+ and symmetric Skin: Skin color, texture, turgor normal. No rashes or lesions Neurologic: Grossly normal Psych: Pleasant   EKG: N/A  ASSESSMENT: Type 2 diabetes-uncontrolled, A1c 10.4% RBBB CAD with CAC score 337, 98th percentile for age and sex matched controls (12/2021) Hypertension Dyslipidemia Family history of premature coronary disease in her father Severe OSA  PLAN: 1.   Ms. Mayford KnifeWilliams was found to have severe obstructive  sleep apnea.  She will need to start on CPAP therapy.  She was also noted to have pulmonary hypertension and multivessel coronary disease although nonobstructive.  I advised starting low-dose aspirin 81 mg daily.  She will need to continue to work with her PCP to lower her A1c.  Cholesterol now at target with LDL 52 on high intensity statin.  Blood pressure is well controlled.  Continue to encourage activity and exercise.  We will plan follow-up in 6 months or sooner as necessary.  Chrystie Nose, MD, [facility], FACP  [facility]  Medical Director of the Advanced Lipid Disorders &  Cardiovascular Risk Reduction Clinic Diplomate of the American Board of Clinical Lipidology Attending Cardiologist  Direct Dial: [phone]  Fax: [phone]  Website:  [email or web address] Nicely Jalie Eiland 02/10/2022, 4:09 PM

## 2022-02-11 ENCOUNTER — Other Ambulatory Visit: Payer: Self-pay | Admitting: Cardiovascular Disease

## 2022-02-11 ENCOUNTER — Telehealth: Payer: Self-pay | Admitting: *Deleted

## 2022-02-11 DIAGNOSIS — G4733 Obstructive sleep apnea (adult) (pediatric): Secondary | ICD-10-CM

## 2022-02-11 NOTE — Telephone Encounter (Signed)
Patient returned a call to me and was given sleep study results and recommendations. She agrees to proceed with MD recommendations.

## 2022-02-11 NOTE — Telephone Encounter (Signed)
Left message to return a call to discuss sleep study results. 

## 2022-02-11 NOTE — Telephone Encounter (Signed)
-----   Message from [name], MD sent at 02/01/2022  9:39 AM EDT ----- Audrey King, please notify pt of the results and set up for CPAP titration or Auto-PAP

## 2022-03-03 ENCOUNTER — Encounter: Payer: Commercial Managed Care - PPO | Attending: Medical | Admitting: Registered"

## 2022-03-03 ENCOUNTER — Encounter: Payer: Self-pay | Admitting: Registered"

## 2022-03-03 VITALS — Ht 63.0 in | Wt 199.0 lb

## 2022-03-03 DIAGNOSIS — E1165 Type 2 diabetes mellitus with hyperglycemia: Secondary | ICD-10-CM | POA: Diagnosis present

## 2022-03-03 NOTE — Progress Notes (Signed)
Diabetes Self-Management Education  Visit Type: First/Initial  Appt. Start Time: [id number] Appt. End Time: 1018  03/03/2022  [name], identified by name and date of birth, is a 56 y.o. female with a diagnosis of Diabetes: Type 2.   ASSESSMENT  Height 5\' 3"  (1.6 m), weight 199 lb (90.3 kg). Body mass index is 35.25 kg/m.  A1c: 10.4% Medications: Tyler Aas, Trulicity (not able to tolerate metformin) SMBG: high 100s-200s fasting  Pt states she wants to understand how to make good food choices.  Pt reports sleep test recently revealed severe sleep apnea.   Pt states she used to do more meal prepping, but not so much lately. Pt reports her mom in hospital and may affect whether or not she cooks dinner. Pt states she loves vegetables, from dietary recall may not be eating adequate protein.  Foot care: pt reports is checking feet, wears shoes outside, socks indoors.  Physical activity: Pt states she had been more active in the past but has not done much more than walking as part of her work.    Diabetes Self-Management Education - 03/03/22 0915       Visit Information   Visit Type First/Initial      Initial Visit   Diabetes Type Type 2    Date Diagnosed many years ago    Are you currently following a meal plan? No    Are you taking your medications as prescribed? Yes      Health Coping   How would you rate your overall health? Fair      Psychosocial Assessment   Patient Belief/Attitude about Diabetes Motivated to manage diabetes    How often do you need to have someone help you when you read instructions, pamphlets, or other written materials from your doctor or pharmacy? 1 - Never    What is the last grade level you completed in school? 12      Complications   Last HgB A1C per patient/outside source 10.4 %    How often do you check your blood sugar? 3-4 times / week    Fasting Blood glucose range (mg/dL) >200;180-200    Have you had a dilated eye exam in the past 12  months? Yes    Have you had a dental exam in the past 12 months? Yes    Are you checking your feet? Yes    How many days per week are you checking your feet? 7      Dietary Intake   Breakfast peanut butter crackers, coffee truvia, sugar-free creamer    Snack (morning) sometimes fruit    Lunch zaxbys salads OR vegetable plate sometimes with chicken OR leftovers    Snack (afternoon) none    Dinner [employer] broccoli and cheese, chicken pie, cabbage, Peak zero sugar tea    Snack (evening) none    Beverage(s) not much water 1-2 bottles, coffee, no-sugar tea      Activity / Exercise   Activity / Exercise Type ADL's   walks a lot at work   How many days per week do you exercise? 0    How many minutes per day do you exercise? 0    Total minutes per week of exercise 0      Patient Education   Previous Diabetes Education No    Disease Pathophysiology Definition of diabetes, type 1 and 2, and the diagnosis of diabetes    Healthy Eating Role of diet in the treatment of diabetes and the relationship between  the three main macronutrients and blood glucose level;Plate Method;Carbohydrate counting;Meal options for control of blood glucose level and chronic complications.    Being Active Role of exercise on diabetes management, blood pressure control and cardiac health.    Monitoring Purpose and frequency of SMBG.    Chronic complications Assessed and discussed foot care and prevention of foot problems    Diabetes Stress and Support Other (comment)   sleep     Individualized Goals (developed by patient)   Nutrition General guidelines for healthy choices and portions discussed    Physical Activity Exercise 3-5 times per week    Monitoring  Test my blood glucose as discussed      Outcomes   Expected Outcomes Demonstrated interest in learning. Expect positive outcomes    Future DMSE 2 months    Program Status Completed             Individualized Plan for Diabetes Self-Management Training:    Learning Objective:  Patient will have a greater understanding of diabetes self-management. Patient education plan is to attend individual and/or group sessions per assessed needs and concerns.   Patient Instructions  Call insurance to see which continuous glucose monitor they cover. Let them know you are taking insulin. Let your doctor know and have Rx sent to your pharmacy.  Consider increasing your vegetable intake Aim to include protein with your breakfast: Scrambled eggs, tomato, whole grain bread Protein shake Dinner: make enough dinner for lunch next day Increase water intake: try the flavor cartridge that your son uses  Exercise bike MWF mornings 15-30 min  Check blood sugar: continue checking fasting blood sugar, consider checking after exercise. You can check after you have fruit to see how that is affecting you.  Expected Outcomes:  Demonstrated interest in learning. Expect positive outcomes  Education material provided: ADA - How to Thrive: A Guide for Your Journey with Diabetes, Snack sheet, and Carbohydrate counting sheet  If problems or questions, patient to contact team via:  Phone and MyChart  Future DSME appointment: 2 months

## 2022-03-03 NOTE — Patient Instructions (Addendum)
Call insurance to see which continuous glucose monitor they cover. Let them know you are taking insulin. Let your doctor know and have Rx sent to your pharmacy.  Consider increasing your vegetable intake Aim to include protein with your breakfast: Scrambled eggs, tomato, whole grain bread Protein shake Dinner: make enough dinner for lunch next day Increase water intake: try the flavor cartridge that your son uses  Exercise bike MWF mornings 15-30 min  Check blood sugar: continue checking fasting blood sugar, consider checking after exercise. You can check after you have fruit to see how that is affecting you.

## 2022-03-04 ENCOUNTER — Telehealth: Payer: Self-pay | Admitting: *Deleted

## 2022-03-04 NOTE — Telephone Encounter (Signed)
Per [facility] web portal no PA is required for CPAP titration. Case ID: [id number].

## 2022-03-28 ENCOUNTER — Ambulatory Visit (HOSPITAL_BASED_OUTPATIENT_CLINIC_OR_DEPARTMENT_OTHER): Payer: Commercial Managed Care - PPO | Attending: Cardiovascular Disease | Admitting: Cardiovascular Disease

## 2022-03-28 ENCOUNTER — Other Ambulatory Visit: Payer: Self-pay

## 2022-03-28 DIAGNOSIS — G4733 Obstructive sleep apnea (adult) (pediatric): Secondary | ICD-10-CM | POA: Diagnosis present

## 2022-03-29 DIAGNOSIS — G4733 Obstructive sleep apnea (adult) (pediatric): Secondary | ICD-10-CM | POA: Insufficient documentation

## 2022-04-03 NOTE — Procedures (Signed)
Patient Name: Audrey King, Audrey King Date: 03/28/2022 Gender: Female D.O.B: [birth year] Age (years): 55 Referring Provider: Lisette Abu Hilty Height (inches): 64 Interpreting Physician: Nicki Guadalajara MD, ABSM Weight (lbs): 199 RPSGT: Lowry Ram BMI: 34 MRN: [id number] Neck Size: 15.50  CLINICAL INFORMATION The patient is referred for a CPAP titration to treat sleep apnea.  Date of HST: 01/24/2022: AHI 31.9/h; supine AHI 40.6/h; O2 nadir 87%.  SLEEP STUDY TECHNIQUE As per the AASM Manual for the Scoring of Sleep and Associated Events v2.3 (April 2016) with a hypopnea requiring 4% desaturations.  The channels recorded and monitored were frontal, central and occipital EEG, electrooculogram (EOG), submentalis EMG (chin), nasal and oral airflow, thoracic and abdominal wall motion, anterior tibialis EMG, snore microphone, electrocardiogram, and pulse oximetry. Continuous positive airway pressure (CPAP) was initiated at the beginning of the study and titrated to treat sleep-disordered breathing.  MEDICATIONS aspirin EC 81 MG tablet cetirizine (ZYRTEC) 10 MG tablet Cholecalciferol (VITAMIN D) 50 MCG (2000 UT) CAPS insulin degludec (TRESIBA FLEXTOUCH) 100 UNIT/ML FlexTouch Pen metoprolol tartrate (LOPRESSOR) 100 MG tablet (Expired) Multiple Vitamin (MULTIVITAMIN) tablet omeprazole (PRILOSEC) 20 MG capsule rosuvastatin (CRESTOR) 40 MG tablet telmisartan-hydrochlorothiazide (MICARDIS HCT) 80-12.5 MG tablet TRULICITY 4.5 MG/0.5ML SOPN  Medications self-administered by patient taken the night of the study : N/A  TECHNICIAN COMMENTS Comments added by technician: Patient was restless all through the night. Patient had difficulty initiating sleep. Comments added by scorer: N/A  RESPIRATORY PARAMETERS Optimal PAP Pressure (cm):  AHI at Optimal Pressure (/hr): N/A Overall Minimal O2 (%): 86.0 Supine % at Optimal Pressure (%): N/A Minimal O2 at Optimal Pressure  (%): 86.0   SLEEP ARCHITECTURE The study was initiated at 10:36:29 PM and ended at 4:58:49 AM.  Sleep onset time was 51.2 minutes and the sleep efficiency was 46.7%%. The total sleep time was 178.5 minutes.  The patient spent 7.8%% of the night in stage N1 sleep, 84.3%% in stage N2 sleep, 0.0%% in stage N3 and 7.8% in REM.Stage REM latency was 130.5 minutes  Wake after sleep onset was 152.6. Alpha intrusion was absent. Supine sleep was 71.28%.  CARDIAC DATA The 2 lead EKG demonstrated sinus rhythm. The mean heart rate was 75.3 beats per minute. Other EKG findings include: None.  LEG MOVEMENT DATA The total Periodic Limb Movements of Sleep (PLMS) were 0. The PLMS index was 0.0. A PLMS index of <15 is considered normal in adults.  IMPRESSIONS - CPAP was initiated at 5 cm and was titrated to 10 cm of water (AHI 0/h, O2 nadir 91% with supine sleep but absent REM sleep at 10 cm). - Mild oxygen desaturations to a nadir of 86% at 7 cm. - The patient snored with soft snoring volume during this titration study. - No cardiac abnormalities were observed during this study. - Clinically significant periodic limb movements were not noted during this study. Arousals associated with PLMs were rare.  DIAGNOSIS - Obstructive Sleep Apnea (G47.33)  RECOMMENDATIONS - Recommend an initial trial of CPAP Auto with EPR of 3 at 10 - 15 cm of water with heated humidification. A ResMed AirFit F20 mask was used for the titration due to oral venting. - Effort should be made to optimize nasal and oropharyngeal patency.  - Avoid alcohol, sedatives and other CNS depressants that may worsen sleep apnea and disrupt normal sleep architecture. - Sleep hygiene should be reviewed to assess factors that may improve sleep quality. - Weight management and regular exercise should be initiated or continued. -  Recommend a download and sleep clinic evaluation after 4 weeks of therapy.   [Electronically signed] 04/03/2022  12:41 PM  Nicki Guadalajara MD, [facility] ABSM Diplomate, American Board of Sleep Medicine  NPI: [id number]  [facility] PH: [phone]   FX: [phone] ACCREDITED BY THE AMERICAN ACADEMY OF SLEEP MEDICINE

## 2022-04-06 ENCOUNTER — Other Ambulatory Visit: Payer: Self-pay | Admitting: Medical

## 2022-04-16 ENCOUNTER — Telehealth: Payer: Self-pay | Admitting: Medical

## 2022-04-16 NOTE — Telephone Encounter (Signed)
Pt called and would like to know the results of her sleep study

## 2022-04-18 ENCOUNTER — Ambulatory Visit
Admission: RE | Admit: 2022-04-18 | Discharge: 2022-04-18 | Disposition: A | Discharge status: Home / Self Care | Payer: Commercial Managed Care - PPO | Source: Ambulatory Visit | Attending: Medical | Admitting: Medical

## 2022-04-18 DIAGNOSIS — Z8269 Family history of other diseases of the musculoskeletal system and connective tissue: Secondary | ICD-10-CM

## 2022-04-18 DIAGNOSIS — E2839 Other primary ovarian failure: Secondary | ICD-10-CM

## 2022-04-18 DIAGNOSIS — Z0000 Encounter for general adult medical examination without abnormal findings: Secondary | ICD-10-CM

## 2022-04-18 DIAGNOSIS — E8940 Asymptomatic postprocedural ovarian failure: Secondary | ICD-10-CM

## 2022-04-20 ENCOUNTER — Other Ambulatory Visit: Payer: Self-pay | Admitting: Medical

## 2022-04-21 ENCOUNTER — Ambulatory Visit (INDEPENDENT_AMBULATORY_CARE_PROVIDER_SITE_OTHER): Payer: Commercial Managed Care - PPO | Admitting: Medical

## 2022-04-21 VITALS — BP 120/70 | HR 86 | Wt 203.0 lb

## 2022-04-21 DIAGNOSIS — E1165 Type 2 diabetes mellitus with hyperglycemia: Secondary | ICD-10-CM | POA: Diagnosis not present

## 2022-04-21 DIAGNOSIS — E559 Vitamin D deficiency, unspecified: Secondary | ICD-10-CM

## 2022-04-21 DIAGNOSIS — E785 Hyperlipidemia, unspecified: Secondary | ICD-10-CM

## 2022-04-21 DIAGNOSIS — I10 Essential (primary) hypertension: Secondary | ICD-10-CM | POA: Diagnosis not present

## 2022-04-21 DIAGNOSIS — G4733 Obstructive sleep apnea (adult) (pediatric): Secondary | ICD-10-CM | POA: Diagnosis not present

## 2022-04-21 DIAGNOSIS — E1169 Type 2 diabetes mellitus with other specified complication: Secondary | ICD-10-CM

## 2022-04-21 NOTE — Progress Notes (Signed)
Subjective:  Audrey King is a 56 y.o. female who presents for Chief Complaint  Patient presents with   discuss sleep study     Here for med check.  I saw her back in February 2023.  In May she had a sleep study but then was referred back for titration study.  She still has not heard back about the CPAP.  She was given a mask but no machine.  Here to discuss sleep study results.  Diabetes-she is compliant with Trulicity weekly and Tresiba 44 units daily.  She does note a lot of foul belching, feeling gassy and bloated, gets some upset stomach and nausea.  She thought it was from her [place] at higher dose.  She was on Ozempic at 1 point but thinks insurance would not pay for this so was switched to Trulicity.  Blood sugars average around 150 fasting but sometimes gets them in the 120s.  She is doing some walking for exercise but getting ready to do a stationary bike that she bought.  She did not tolerate [place] prior  Vitamin D deficiency-compliant with vitamin D supplement.  Since her last visit here she saw nutritionist and cardiologist.  Her Crestor was increased to 40 mg daily  No other aggravating or relieving factors.    No other c/o.  Past Medical History:  Diagnosis Date   Allergy    Chronic back pain    Diabetes mellitus without complication (HCC)    Hyperlipidemia    Hypertension    Current Outpatient Medications on File Prior to Visit  Medication Sig Dispense Refill   aspirin EC 81 MG tablet Take 81 mg by mouth daily. Swallow whole.     cetirizine (ZYRTEC) 10 MG tablet Take 10 mg by mouth daily.     Cholecalciferol (VITAMIN D) 50 MCG (2000 UT) CAPS Take by mouth.     Dulaglutide (TRULICITY) 4.5 MG/0.5ML SOPN INJECT THE CONTENTS OF 1 PEN(4.5 MG) SUBCUTANEOUSLY ONCE A WEEK 2 mL 5   Insulin Degludec FlexTouch 100 UNIT/ML SOPN INJECT 50 UNITS INTO THE SKIN DAILY AT 10 PM 15 mL 0   Multiple Vitamin (MULTIVITAMIN) tablet Take 1 tablet by mouth daily.     omeprazole  (PRILOSEC) 20 MG capsule Take 20 mg by mouth daily.     rosuvastatin (CRESTOR) 40 MG tablet Take 1 tablet (40 mg total) by mouth daily. 90 tablet 3   telmisartan-hydrochlorothiazide (MICARDIS HCT) 80-12.5 MG tablet Take 1 tablet by mouth daily. telmisartan 80 mg-hydrochlorothiazide 12.5 mg tablet 90 tablet 3   No current facility-administered medications on file prior to visit.     The following portions of the patient's history were reviewed and updated as appropriate: allergies, current medications, past family history, past medical history, past social history, past surgical history and problem list.  ROS Otherwise as in subjective above  Objective: BP 120/70   Pulse 86   Wt 203 lb (92.1 kg)   BMI 35.96 kg/m   General appearance: alert, no distress, well developed, well nourished Neck: supple, no lymphadenopathy, no thyromegaly, no masses Heart: RRR, normal S1, S2, no murmurs Lungs: CTA bilaterally, no wheezes, rhonchi, or rales Abdomen: +bs, soft, non tender, non distended, no masses, no hepatomegaly, no splenomegaly Pulses: 2+ radial pulses, 2+ pedal pulses, normal cap refill Ext: no edema    Assessment: Encounter Diagnoses  Name Primary?   Essential hypertension, benign Yes   OSA (obstructive sleep apnea)    Hyperlipidemia associated with type 2 diabetes mellitus (HCC)  Uncontrolled type 2 diabetes mellitus with hyperglycemia (HCC)    Vitamin D deficiency      Plan: Hypertension-continue same medication telmisartan HCT  Hyperlipidemia-continue Crestor 40 mg daily  Diabetes-continue Tresiba with likely increase in units.  We will likely need to switch out Trulicity for something else given the GI side effects.  We discussed that the side effects could continue with any of the GLP-1 medications.  Counseled on diet, exercise, monitoring blood sugars.  hemoglobin A1c lab today.  Vitamin D deficiency-updated labs today, she has been compliant with  supplement  OSA-we discussed her sleep study results showing severe sleep apnea.  Referral for CPAP device.  Discussed avoiding sleeping supine.  We discussed the need to lose weight through healthy diet and exercise.  Jessica was seen today for discuss sleep study.  Diagnoses and all orders for this visit:  Essential hypertension, benign  OSA (obstructive sleep apnea)  Hyperlipidemia associated with type 2 diabetes mellitus (HCC)  Uncontrolled type 2 diabetes mellitus with hyperglycemia (HCC) -     Hemoglobin A1c  Vitamin D deficiency -     VITAMIN D 25 Hydroxy (Vit-D Deficiency, Fractures)    Follow up: pending lab

## 2022-04-21 NOTE — Addendum Note (Signed)
Addended by: Herminio Commons A on: 04/21/2022 05:12 PM   Modules accepted: Orders

## 2022-04-22 ENCOUNTER — Telehealth: Payer: Self-pay | Admitting: Medical

## 2022-04-22 ENCOUNTER — Other Ambulatory Visit: Payer: Self-pay | Admitting: Medical

## 2022-04-22 LAB — HEMOGLOBIN A1C
Est. average glucose Bld gHb Est-mCnc: 229 mg/dL
Hgb A1c MFr Bld: 9.6 % — ABNORMAL HIGH (ref 4.8–5.6)

## 2022-04-22 LAB — VITAMIN D 25 HYDROXY (VIT D DEFICIENCY, FRACTURES): Vit D, 25-Hydroxy: 38.2 ng/mL (ref 30.0–100.0)

## 2022-04-22 MED ORDER — TIRZEPATIDE 5 MG/0.5ML ~~LOC~~ SOAJ: 5.0000 mg | SUBCUTANEOUS | 3 refills | Status: DC

## 2022-04-22 MED ORDER — INSULIN DEGLUDEC FLEXTOUCH 100 UNIT/ML ~~LOC~~ SOPN: PEN_INJECTOR | SUBCUTANEOUS | 5 refills | Status: DC

## 2022-04-22 NOTE — Telephone Encounter (Signed)
Mounjaro over $500 per month   ? Copay card etc or change rx

## 2022-04-23 NOTE — Telephone Encounter (Signed)
Called pharmacy with discount card and insurance automatically had one attached.  [employer] company Katrinka [facility] T# [phone] and is not covered, need P.A. if covered will only cost $60, pt does not have deductible. Completed P.A. thru cover my meds.  Called pt and informed

## 2022-04-29 ENCOUNTER — Telehealth: Payer: Self-pay | Admitting: *Deleted

## 2022-04-29 NOTE — Telephone Encounter (Signed)
Patient notified via Mychart.

## 2022-04-29 NOTE — Telephone Encounter (Signed)
Call received from the patient informing me that she got her CPAP machine from [facility] yesterday, ordered by Crosby Oyster, PA-C. Order started by me with [employer] was cancelled. [facility] notified Dr Tresa Endo will be managing CPAP therapy. Please attach him to [facility].

## 2022-04-29 NOTE — Telephone Encounter (Signed)
-----   Message from [name], MD sent at 04/03/2022 12:46 PM EDT ----- Burna Mortimer, please notify DME for CPAP initiation.

## 2022-05-02 NOTE — Telephone Encounter (Signed)
P.A. approved til 04/29/23 went thru for $25, pt informed

## 2022-05-19 ENCOUNTER — Ambulatory Visit: Payer: Commercial Managed Care - PPO | Admitting: Registered"

## 2022-05-28 ENCOUNTER — Encounter: Payer: Self-pay | Admitting: Internal Medicine

## 2022-07-01 ENCOUNTER — Ambulatory Visit: Payer: Commercial Managed Care - PPO | Admitting: Medical

## 2022-07-22 ENCOUNTER — Ambulatory Visit: Payer: Commercial Managed Care - PPO | Admitting: Medical

## 2022-07-22 VITALS — BP 110/60 | HR 83 | Wt 203.6 lb

## 2022-07-22 DIAGNOSIS — Z8249 Family history of ischemic heart disease and other diseases of the circulatory system: Secondary | ICD-10-CM

## 2022-07-22 DIAGNOSIS — E1169 Type 2 diabetes mellitus with other specified complication: Secondary | ICD-10-CM

## 2022-07-22 DIAGNOSIS — Z23 Encounter for immunization: Secondary | ICD-10-CM

## 2022-07-22 DIAGNOSIS — E1165 Type 2 diabetes mellitus with hyperglycemia: Secondary | ICD-10-CM | POA: Diagnosis not present

## 2022-07-22 DIAGNOSIS — Z7185 Encounter for immunization safety counseling: Secondary | ICD-10-CM | POA: Diagnosis not present

## 2022-07-22 DIAGNOSIS — E785 Hyperlipidemia, unspecified: Secondary | ICD-10-CM

## 2022-07-22 DIAGNOSIS — I10 Essential (primary) hypertension: Secondary | ICD-10-CM

## 2022-07-22 DIAGNOSIS — Z636 Dependent relative needing care at home: Secondary | ICD-10-CM

## 2022-07-22 DIAGNOSIS — G4733 Obstructive sleep apnea (adult) (pediatric): Secondary | ICD-10-CM

## 2022-07-22 LAB — POCT GLYCOSYLATED HEMOGLOBIN (HGB A1C): Hemoglobin A1C: 8.6 % — AB (ref 4.0–5.6)

## 2022-07-22 MED ORDER — TELMISARTAN-HCTZ 80-12.5 MG PO TABS
1.0000 | ORAL_TABLET | Freq: Every day | ORAL | 3 refills | Status: DC
Start: 2022-07-22 — End: 2022-11-12

## 2022-07-22 MED ORDER — TIRZEPATIDE 10 MG/0.5ML ~~LOC~~ SOAJ: 10.0000 mg | SUBCUTANEOUS | 2 refills | Status: DC

## 2022-07-22 MED ORDER — TIRZEPATIDE 7.5 MG/0.5ML ~~LOC~~ SOAJ: 7.5000 mg | SUBCUTANEOUS | 0 refills | Status: DC

## 2022-07-22 NOTE — Progress Notes (Signed)
Subjective:  Audrey King is a 56 y.o. female who presents for Chief Complaint  Patient presents with   fasting med check    Fasitng med check, no concerns. Would like flu shot today.      Here for med check.  Compliant with Mounjaro 5 mg weekly and long-acting insulin 50 units at night.  Prior to last visit she was not tolerating Trulicity so we switched to [facility].  She also has not tolerated Ozempic or metformin in the past.  Blood sugars have been running high lately not at goal.  Hypertension-compliant with telmisartan HCT without complaint  Hyperlipidemia-compliant with Crestor 40 mg daily  Not getting exercise a lot because she works 2 part-time jobs which ends up being a full-time hours and she will take care of her parents.  Lately her father had COVID, her mom had an arm fracture back in June, her father goes to dialysis.  So she stays busy.  OSA-compliant with CPAP.  Still feels tired  She would like a flu shot today  No other aggravating or relieving factors.    No other c/o.  Past Medical History:  Diagnosis Date   Allergy    Chronic back pain    Diabetes mellitus without complication ([place])    Hyperlipidemia    Hypertension    Current Outpatient Medications on File Prior to Visit  Medication Sig Dispense Refill   aspirin EC 81 MG tablet Take 81 mg by mouth daily. Swallow whole.     cetirizine (ZYRTEC) 10 MG tablet Take 10 mg by mouth daily.     Cholecalciferol (VITAMIN D) 50 MCG (2000 UT) CAPS Take by mouth.     Insulin Degludec FlexTouch 100 UNIT/ML SOPN INJECT 50 UNITS INTO THE SKIN DAILY AT 10 PM 15 mL 5   Multiple Vitamin (MULTIVITAMIN) tablet Take 1 tablet by mouth daily.     omeprazole (PRILOSEC) 20 MG capsule Take 20 mg by mouth daily.     rosuvastatin (CRESTOR) 40 MG tablet Take 1 tablet (40 mg total) by mouth daily. 90 tablet 3   No current facility-administered medications on file prior to visit.     The following portions of the patient's  history were reviewed and updated as appropriate: allergies, current medications, past family history, past medical history, past social history, past surgical history and problem list.  ROS Otherwise as in subjective above  Objective: BP 110/60   Pulse 83   Wt 203 lb 9.6 oz (92.4 kg)   BMI 36.07 kg/m   General appearance: alert, no distress, well developed, well nourished Neck: supple, no lymphadenopathy, no thyromegaly, no masses Heart: RRR, normal S1, S2, no murmurs Lungs: CTA bilaterally, no wheezes, rhonchi, or rales Pulses: 2+ radial pulses, 2+ pedal pulses, normal cap refill Ext: no edema  Diabetic Foot Exam - Simple   Simple Foot Form Diabetic Foot exam was performed with the following findings: Yes 07/22/2022  2:13 PM  Visual Inspection No deformities, no ulcerations, no other skin breakdown bilaterally: Yes Sensation Testing Intact to touch and monofilament testing bilaterally: Yes Pulse Check Posterior Tibialis and Dorsalis pulse intact bilaterally: Yes Comments      Assessment: Encounter Diagnoses  Name Primary?   Uncontrolled type 2 diabetes mellitus with hyperglycemia (HCC) Yes   Need for influenza vaccination    Essential hypertension, benign    Vaccine counseling    OSA (obstructive sleep apnea)    Hyperlipidemia associated with type 2 diabetes mellitus ([place])    Family  history of premature CAD    Caregiver burden      Plan: Diabetes-not quite to goal but improving.  Increase Mounjaro to 7.5 mg weekly for the next month then increase to 10 mg weekly.  Continue Tresiba long-acting insulin 50 units nightly.  We discussed freestyle libre.  Hyperlipidemia associate with diabetes-continue statin  Hypertension-BP at goal, continue current medication  OSA-I reviewed recent compliance report which shows really good compliance over 97%.  Continue CPAP  Fatigue-likely multifactorial but she works full-time hours and helps take care of her parents so likely  the source of her fatigue.  We did discuss getting some exercise and is a way to improve her energy  Counseled on the influenza virus vaccine.  Vaccine information sheet given.  Influenza vaccine given after consent obtained.    Adonna was seen today for fasting med check.  Diagnoses and all orders for this visit:  Uncontrolled type 2 diabetes mellitus with hyperglycemia (HCC) -     HgB A1c  Need for influenza vaccination -     Flu Vaccine QUAD 6+ mos PF IM (Fluarix Quad PF)  Essential hypertension, benign  Vaccine counseling  OSA (obstructive sleep apnea)  Hyperlipidemia associated with type 2 diabetes mellitus (HCC)  Family history of premature CAD  Caregiver burden  Other orders -     tirzepatide (MOUNJARO) 7.5 MG/0.5ML Pen; Inject 7.5 mg into the skin once a week. -     tirzepatide (MOUNJARO) 10 MG/0.5ML Pen; Inject 10 mg into the skin once a week. -     telmisartan-hydrochlorothiazide (MICARDIS HCT) 80-12.5 MG tablet; Take 1 tablet by mouth daily. telmisartan 80 mg-hydrochlorothiazide 12.5 mg tablet    Follow up: 66mo

## 2022-07-23 ENCOUNTER — Telehealth: Payer: Self-pay | Admitting: Internal Medicine

## 2022-07-23 MED ORDER — FREESTYLE LIBRE 2 SENSOR MISC: 5 refills | Status: DC

## 2022-07-23 MED ORDER — FREESTYLE LIBRE 2 READER DEVI: 0 refills | Status: DC

## 2022-07-23 NOTE — Telephone Encounter (Signed)
Pt was giveen libre 2 sensor in office yesterday but its not compaibtle with phone. I have sent in sensors reader to pharmacy that she will pick up

## 2022-07-28 ENCOUNTER — Ambulatory Visit: Payer: Commercial Managed Care - PPO | Admitting: Internal Medicine

## 2022-07-28 ENCOUNTER — Telehealth: Payer: Self-pay | Admitting: Internal Medicine

## 2022-07-28 NOTE — Telephone Encounter (Signed)
Pt called and states mounjaro is over $200 and she can't afford that. Is there another option for

## 2022-07-29 ENCOUNTER — Other Ambulatory Visit: Payer: Self-pay | Admitting: Medical

## 2022-07-29 ENCOUNTER — Encounter: Payer: Self-pay | Admitting: Medical

## 2022-07-29 MED ORDER — RYBELSUS 14 MG PO TABS: 1.0000 | ORAL_TABLET | Freq: Every day | ORAL | 0 refills | Status: DC

## 2022-07-29 MED ORDER — RYBELSUS 7 MG PO TABS: 1.0000 | ORAL_TABLET | Freq: Every day | ORAL | 0 refills | Status: DC

## 2022-07-29 NOTE — Telephone Encounter (Signed)
Pt would like to try Rybelsus first to see if this is cheaper. She was on ozempic but had a hard time finding the medication at pharmacy so that's why she got switched off of it

## 2022-07-29 NOTE — Telephone Encounter (Signed)
Pt was notified of results

## 2022-08-21 ENCOUNTER — Telehealth: Payer: Self-pay | Admitting: Family Medicine

## 2022-08-21 NOTE — Telephone Encounter (Signed)
Pt states 1st week of Rebelsus that she started yeast infection, she has been using OTC for 3 weeks and still has it. Burning, itchy, yuky.  Please call in something to [facility] on [address].

## 2022-08-21 NOTE — Telephone Encounter (Signed)
Pt was notified and says this has been going on for 3 weeks. She thought it cleared up on Monday but yesterday and today doesn't see right. She was offered an appointment for tomorrow afternoon and can't make it. She will get something over the counter to see if it will clear up and if not will call back to schedule something

## 2022-09-10 ENCOUNTER — Telehealth: Payer: Self-pay | Admitting: Internal Medicine

## 2022-09-10 MED ORDER — RYBELSUS 14 MG PO TABS: 1.0000 | ORAL_TABLET | Freq: Every day | ORAL | 0 refills | Status: DC

## 2022-09-10 NOTE — Telephone Encounter (Signed)
Refill request for Rybelsus to [facility]

## 2022-09-24 MED ORDER — RYBELSUS 14 MG PO TABS: 1.0000 | ORAL_TABLET | Freq: Every day | ORAL | 0 refills | Status: DC

## 2022-09-24 NOTE — Addendum Note (Signed)
Addended by: Minette Headland A on: 09/24/2022 08:46 AM   Modules accepted: Orders

## 2022-09-24 NOTE — Telephone Encounter (Signed)
Rybelsus was to go to [facility] home delivery

## 2022-10-22 ENCOUNTER — Ambulatory Visit (INDEPENDENT_AMBULATORY_CARE_PROVIDER_SITE_OTHER): Payer: Commercial Managed Care - PPO | Admitting: Family Medicine

## 2022-10-22 ENCOUNTER — Encounter: Payer: Self-pay | Admitting: Family Medicine

## 2022-10-22 VITALS — BP 122/72 | HR 64 | Temp 98.6°F | Ht 63.0 in | Wt 194.8 lb

## 2022-10-22 DIAGNOSIS — B372 Candidiasis of skin and nail: Secondary | ICD-10-CM

## 2022-10-22 DIAGNOSIS — K5903 Drug induced constipation: Secondary | ICD-10-CM

## 2022-10-22 DIAGNOSIS — E1165 Type 2 diabetes mellitus with hyperglycemia: Secondary | ICD-10-CM

## 2022-10-22 MED ORDER — NYSTATIN 100000 UNIT/GM EX POWD: 1.0000 | Freq: Three times a day (TID) | CUTANEOUS | 0 refills | Status: DC

## 2022-10-22 NOTE — Progress Notes (Signed)
Chief Complaint  Patient presents with   Rash    Rash on left breast, underneath. Also under stomach area and in bikini area. Used Lume product in these areas for 4 days and had no problem (trial size). Purchased full size and has been using for a week now and has been broken out. Rash is itchy.   Used Lume sample (deoderant cream) x 3-4 days--helped, and didn't bother her at first. Didn't have any for a couple of weeks (after sample ran out), then bought a larger tube. Rash started after using this cream 3-4 days--started at the left breast, then noted rash under her stomach and at groin. These are the locations she has used the cream. She also used it under the right breast and both axillae, but no rash developed in those locations. Rash is itchy.  She stopped using the Lume. She has been using cornstarch powder. She used HC cream just for one day (yesterday)--didn't help much.  Diabetic. Sugar was 100 this morning. Sugars much better with the recent increase in Rybelsus, and has lost 10#  Lab Results  Component Value Date   HGBA1C 8.6 (A) 07/22/2022   PMH, PSH, SH reviewed  Outpatient Encounter Medications as of 10/22/2022  Medication Sig Note   aspirin EC 81 MG tablet Take 81 mg by mouth daily. Swallow whole.    cetirizine (ZYRTEC) 10 MG tablet Take 10 mg by mouth daily.    Cholecalciferol (VITAMIN D) 50 MCG (2000 UT) CAPS Take by mouth.    Insulin Degludec FlexTouch 100 UNIT/ML SOPN INJECT 50 UNITS INTO THE SKIN DAILY AT 10 PM    Multiple Vitamin (MULTIVITAMIN) tablet Take 1 tablet by mouth daily.    omeprazole (PRILOSEC) 20 MG capsule Take 20 mg by mouth daily.    rosuvastatin (CRESTOR) 40 MG tablet Take 1 tablet (40 mg total) by mouth daily.    Semaglutide (RYBELSUS) 14 MG TABS Take 1 tablet (14 mg total) by mouth daily.    telmisartan-hydrochlorothiazide (MICARDIS HCT) 80-12.5 MG tablet Take 1 tablet by mouth daily. telmisartan 80 mg-hydrochlorothiazide 12.5 mg tablet     hydrocortisone 1 % ointment Apply 1 Application topically 2 (two) times daily. (Patient not taking: Reported on 10/22/2022) 10/22/2022: Used yesterday   [DISCONTINUED] Continuous Blood Gluc Receiver (FREESTYLE LIBRE 2 READER) DEVI Changes sensor every 14 days    [DISCONTINUED] Continuous Blood Gluc Sensor (FREESTYLE LIBRE 2 SENSOR) MISC Change sensor every 14 days    [DISCONTINUED] Semaglutide (RYBELSUS) 7 MG TABS Take 1 tablet by mouth daily.    No facility-administered encounter medications on file as of 10/22/2022.   Allergies  Allergen Reactions   Gabapentin     Crying spells and head fuzzy feeling   Metformin And Related     Nausea and vomiting within 30 min of eating   Penicillins Swelling    Has patient had a PCN reaction causing immediate rash, facial/tongue/throat swelling, SOB or lightheadedness with hypotension:YES Has patient had a PCN reaction causing severe rash involving mucus membranes or skin necrosis: NO Has patient had a PCN reaction that required hospitalization NO Has patient had a PCN reaction occurring within the last 10 years: NO If all of the above answers are "NO", then may proceed with Cephalosporin use.   ROS:  No f/c. No n/v/d, constipation since on rybelsus 10# weight loss Sugars improved No URI symptoms, chest pain, shortness of breath. Rash per HPI   PHYSICAL EXAM:  BP 122/72   Pulse 64  Temp 98.6 F (37 C) (Tympanic)   Ht 5\' 3"  (1.6 m)   Wt 194 lb 12.8 oz (88.4 kg)   BMI 34.51 kg/m   Wt Readings from Last 3 Encounters:  10/22/22 194 lb 12.8 oz (88.4 kg)  07/22/22 203 lb 9.6 oz (92.4 kg)  04/21/22 203 lb (92.1 kg)   Well-appearing, pleasant, obese female in no distress Below the left breast there is an erythematous rash in the skin fold, with satellite lesions. Skin intact, no weeping.  Similar-appearing rash is noted at lower abdominal skin fold, and at the groin bilaterally.  +satellite lesions. Skin under right breast is  normal.  ASSESSMENT/PLAN:  Candidal intertrigo - DDx of her rash discussed, intertrigo (location, DM), vs contact derm--doubt given lack of rash under R breast and axillae where also used cream - Plan: nystatin (MYCOSTATIN/NYSTOP) powder  Uncontrolled type 2 diabetes mellitus with hyperglycemia (HCC) - sugars improving since rybelsus added and dose titrated up. Cont current meds  Drug-induced constipation - side effect from rybelsus. Discussed fiber supplements, high fiber diet, adequate water intake, miralax

## 2022-10-22 NOTE — Patient Instructions (Addendum)
Consider using Miralax regularly to help treat/prevent constipation. This is better than using Dulcolax regulary. You will need to find the dose that works well for you. Start with 1 capful every day until you get results, then back off (either on the dose--cut back to 1/2 capful, vs cutting back on the frequency, using it just a few times/week). Titrate the dose to your bowels--more or less frequent depending on if you are having too frequent or too loose of stools, or if still constipated. Try to eat a high fiber diet, and/or use a fiber supplement (benefiber, metamucil, etc). Be sure to drink plenty of water.  Your rash is likely a yeast infection (candida) due to the moisture of the skin folds, and being a diabetic.  I can't rule out an allergic reaction to the new cream, though I would expect that you would have also broken out under your arms and the right breast.  Use the Nystatin powder to treat the infection. (You can switch to an over-the-counter athlete's foot medication such as lotrimin or lamisil, if preferred). You may also use the hydrocortisone if needed for itching. Avoid the Lume cream--consider re-trying on a test location if desired in the future (that would prove whether there is something you are reacting to in it or not).

## 2022-11-01 ENCOUNTER — Other Ambulatory Visit: Payer: Self-pay | Admitting: Family Medicine

## 2022-11-01 DIAGNOSIS — B372 Candidiasis of skin and nail: Secondary | ICD-10-CM

## 2022-11-03 NOTE — Telephone Encounter (Signed)
Patient only got half of this rx on 1/31 (30g) pharmacy didn't have enough to fill. Patient says they were supposed to call her when they got the other half, they never did. She called them as she still has one spot left (rash) and they said they filled it but put it back and she would need a new rx. I called over to see why they could not fill-pharmacist said they "closed" the rx. Can you please refill?

## 2022-11-03 NOTE — Telephone Encounter (Signed)
Filled the additional 53m

## 2022-11-11 ENCOUNTER — Other Ambulatory Visit: Payer: Self-pay | Admitting: Medical

## 2022-11-11 ENCOUNTER — Ambulatory Visit (INDEPENDENT_AMBULATORY_CARE_PROVIDER_SITE_OTHER): Payer: Commercial Managed Care - PPO | Admitting: Medical

## 2022-11-11 VITALS — BP 110/68 | HR 77 | Ht 64.0 in | Wt 194.2 lb

## 2022-11-11 DIAGNOSIS — G4733 Obstructive sleep apnea (adult) (pediatric): Secondary | ICD-10-CM | POA: Diagnosis not present

## 2022-11-11 DIAGNOSIS — Z7185 Encounter for immunization safety counseling: Secondary | ICD-10-CM

## 2022-11-11 DIAGNOSIS — E785 Hyperlipidemia, unspecified: Secondary | ICD-10-CM

## 2022-11-11 DIAGNOSIS — I10 Essential (primary) hypertension: Secondary | ICD-10-CM | POA: Diagnosis not present

## 2022-11-11 DIAGNOSIS — E1165 Type 2 diabetes mellitus with hyperglycemia: Secondary | ICD-10-CM

## 2022-11-11 DIAGNOSIS — Z8249 Family history of ischemic heart disease and other diseases of the circulatory system: Secondary | ICD-10-CM

## 2022-11-11 DIAGNOSIS — Z0000 Encounter for general adult medical examination without abnormal findings: Secondary | ICD-10-CM

## 2022-11-11 DIAGNOSIS — L304 Erythema intertrigo: Secondary | ICD-10-CM

## 2022-11-11 DIAGNOSIS — E2839 Other primary ovarian failure: Secondary | ICD-10-CM

## 2022-11-11 DIAGNOSIS — E1169 Type 2 diabetes mellitus with other specified complication: Secondary | ICD-10-CM

## 2022-11-11 DIAGNOSIS — E569 Vitamin deficiency, unspecified: Secondary | ICD-10-CM

## 2022-11-11 DIAGNOSIS — E8940 Asymptomatic postprocedural ovarian failure: Secondary | ICD-10-CM

## 2022-11-11 DIAGNOSIS — L989 Disorder of the skin and subcutaneous tissue, unspecified: Secondary | ICD-10-CM

## 2022-11-11 DIAGNOSIS — Z1231 Encounter for screening mammogram for malignant neoplasm of breast: Secondary | ICD-10-CM

## 2022-11-11 LAB — POCT URINALYSIS DIP (PROADVANTAGE DEVICE)
Bilirubin, UA: NEGATIVE
Blood, UA: NEGATIVE
Glucose, UA: NEGATIVE mg/dL
Ketones, POC UA: NEGATIVE mg/dL
Leukocytes, UA: NEGATIVE
Nitrite, UA: NEGATIVE
Specific Gravity, Urine: 1.02
Urobilinogen, Ur: NEGATIVE
pH, UA: 6 (ref 5.0–8.0)

## 2022-11-11 MED ORDER — CLARITHROMYCIN 500 MG PO TABS: 500.0000 mg | ORAL_TABLET | Freq: Two times a day (BID) | ORAL | 0 refills | Status: DC

## 2022-11-11 MED ORDER — CLOTRIMAZOLE-BETAMETHASONE 1-0.05 % EX CREA: 1.0000 | TOPICAL_CREAM | Freq: Every day | CUTANEOUS | 0 refills | Status: DC

## 2022-11-11 NOTE — Progress Notes (Signed)
Subjective:   HPI  Audrey King is a 57 y.o. female who presents for Chief Complaint  Patient presents with   Annual Exam    Annual physical. Pt would also like to discuss sinus issues and congestion for two weeks. Pt has been taking zyrtec.      Patient Care Team: Jamiee Milholland, Camelia Eng, PA-C as PCP - General (Family Medicine) Sees dentist Sees eye doctor Dr. Juanita Craver, GI Dr. Lyman Bishop, cardiology   Concerns: She completed a lot of vitamins in the last year and our recommendation including colonoscopy, CT coronary calcium test, bone density test.  She is due for eye exam  She has some ongoing sinus pressure, mucoid drainage, head pressure, upper teeth ache for the last 2 weeks.  It is better but not fully resolved and wants an antibiotic to help clear this up.  Diabetes-compliant with Rybelsus 14 mg daily, insulin degludec 50 units daily, checking feet daily, sugars have looked a lot better this year  compared to last year  Hypertension-compliant with telmisartan HCT  Hyperlipidemia-compliant with rosuvastatin and aspirin  She came in and saw Dr. Tomi Bamberger.  Recently for intertrigo notes rash.  It is improving but still not fully resolved.  Using nystatin powder daily.  She also has 2 skin lesions under the left breast she wants looked at  Reviewed their medical, surgical, family, social, medication, and allergy history and updated chart as appropriate.  Past Medical History:  Diagnosis Date   Allergy    Chronic back pain    Diabetes mellitus without complication ([place])    Hyperlipidemia    Hypertension     Family History  Problem Relation Age of Onset   Hypertension Mother    Diabetes Mother    Heart disease Father 43       MI multiple, stents, CAD   Kidney disease Father    Hypertension Father    Diabetes Father    Cancer Maternal Aunt        colon   Cancer Paternal Aunt        breast   Cancer Paternal Grandmother        breast     Current Outpatient  Medications:    aspirin EC 81 MG tablet, Take 81 mg by mouth daily. Swallow whole., Disp: , Rfl:    cetirizine (ZYRTEC) 10 MG tablet, Take 10 mg by mouth daily., Disp: , Rfl:    Cholecalciferol (VITAMIN D) 50 MCG (2000 UT) CAPS, Take by mouth., Disp: , Rfl:    Insulin Degludec FlexTouch 100 UNIT/ML SOPN, INJECT 50 UNITS INTO THE SKIN DAILY AT 10 PM, Disp: 15 mL, Rfl: 5   Multiple Vitamin (MULTIVITAMIN) tablet, Take 1 tablet by mouth daily., Disp: , Rfl:    nystatin powder, APPLY 1 APPLICATION TOPICALLY THREE TIMES DAILY APPLY TO AFFECTED AREAS OF SKIN UNTIL RASH HAS RESOLVED, Disp: 30 g, Rfl: 0   omeprazole (PRILOSEC) 20 MG capsule, Take 20 mg by mouth daily., Disp: , Rfl:    rosuvastatin (CRESTOR) 40 MG tablet, Take 1 tablet (40 mg total) by mouth daily., Disp: 90 tablet, Rfl: 3   Semaglutide (RYBELSUS) 14 MG TABS, Take 1 tablet (14 mg total) by mouth daily., Disp: 90 tablet, Rfl: 0   telmisartan-hydrochlorothiazide (MICARDIS HCT) 80-12.5 MG tablet, Take 1 tablet by mouth daily. telmisartan 80 mg-hydrochlorothiazide 12.5 mg tablet, Disp: 90 tablet, Rfl: 3   hydrocortisone 1 % ointment, Apply 1 Application topically 2 (two) times daily. (Patient not taking: Reported  on 10/22/2022), Disp: , Rfl:   Allergies  Allergen Reactions   Gabapentin     Crying spells and head fuzzy feeling   Metformin And Related     Nausea and vomiting within 30 min of eating   Penicillins Swelling    Has patient had a PCN reaction causing immediate rash, facial/tongue/throat swelling, SOB or lightheadedness with hypotension:YES Has patient had a PCN reaction causing severe rash involving mucus membranes or skin necrosis: NO Has patient had a PCN reaction that required hospitalization NO Has patient had a PCN reaction occurring within the last 10 years: NO If all of the above answers are "NO", then may proceed with Cephalosporin use.   Review of Systems  Constitutional:  Negative for chills, fever, malaise/fatigue  and weight loss.  HENT:  Positive for congestion and sinus pain. Negative for ear pain, hearing loss, sore throat and tinnitus.   Eyes:  Negative for blurred vision, pain and redness.  Respiratory:  Negative for cough, hemoptysis and shortness of breath.   Cardiovascular:  Negative for chest pain, palpitations, orthopnea, claudication and leg swelling.  Gastrointestinal:  Negative for abdominal pain, blood in stool, constipation, diarrhea, nausea and vomiting.  Genitourinary:  Negative for dysuria, flank pain, frequency, hematuria and urgency.  Musculoskeletal:  Negative for falls, joint pain and myalgias.  Skin:  Positive for rash. Negative for itching.  Neurological:  Negative for dizziness, tingling, speech change, weakness and headaches.  Endo/Heme/Allergies:  Negative for polydipsia. Does not bruise/bleed easily.  Psychiatric/Behavioral:  Negative for depression and memory loss. The patient is not nervous/anxious and does not have insomnia.         11/11/2022    8:32 AM 07/22/2022   12:05 PM 04/21/2022    3:57 PM 03/03/2022    9:20 AM 11/07/2021    8:15 AM  Depression screen PHQ 2/9  Decreased Interest 0 0 0 0 0  Down, Depressed, Hopeless 0 0 0 0 0  PHQ - 2 Score 0 0 0 0 0       Objective:  BP 110/68   Pulse 77   Ht 5' 4"$  (1.626 m)   Wt 194 lb 3.2 oz (88.1 kg)   BMI 33.33 kg/m   Wt Readings from Last 3 Encounters:  11/11/22 194 lb 3.2 oz (88.1 kg)  10/22/22 194 lb 12.8 oz (88.4 kg)  07/22/22 203 lb 9.6 oz (92.4 kg)   BP Readings from Last 3 Encounters:  11/11/22 110/68  10/22/22 122/72  07/22/22 110/60    General appearance: alert, no distress, WD/WN, Caucasian female Pinkish-red coloration under the left breast consistent with intertrigo, there are 2 separate raised lesions that are somewhat verrucal appearing, both around 3 to 4 mm diameter under left breast, no other worrisome lesions HEENT: normocephalic, conjunctiva/corneas normal, sclerae anicteric, PERRLA,  EOMi, nares patent, mild mucoid discharge, positive erythema, pharynx normal Oral cavity: MMM, tongue normal, teeth in good repair Neck: supple, no lymphadenopathy, no thyromegaly, no masses, normal ROM, no bruits Chest: non tender, normal shape and expansion Heart: RRR, normal S1, S2, no murmurs Lungs: CTA bilaterally, no wheezes, rhonchi, or rales Abdomen: +bs, soft, non tender, non distended, no masses, no hepatomegaly, no splenomegaly, no bruits Back: non tender, normal ROM, no scoliosis Musculoskeletal: upper extremities non tender, no obvious deformity, normal ROM throughout, lower extremities non tender, no obvious deformity, normal ROM throughout Extremities: no edema, no cyanosis, no clubbing Pulses: 2+ symmetric, upper and lower extremities, normal cap refill Neurological: alert, oriented x 3,  CN2-12 intact, strength normal upper extremities and lower extremities, sensation normal throughout, DTRs 2+ throughout, no cerebellar signs, gait normal Psychiatric: normal affect, behavior normal, pleasant  Breast/gyn/rectal - deferred/declined  Diabetic Foot Exam - Simple   Simple Foot Form Diabetic Foot exam was performed with the following findings: Yes 11/11/2022  9:00 AM  Visual Inspection See comments: Yes Sensation Testing Intact to touch and monofilament testing bilaterally: Yes Pulse Check Posterior Tibialis and Dorsalis pulse intact bilaterally: Yes Comments Some dry skin of toes, but no other worrisome lesions      Assessment and Plan :   Encounter Diagnoses  Name Primary?   Encounter for health maintenance examination in adult Yes   OSA (obstructive sleep apnea)    Essential hypertension, benign    Hyperlipidemia associated with type 2 diabetes mellitus ([place])    Uncontrolled type 2 diabetes mellitus with hyperglycemia ([place])    Vaccine counseling    Surgical menopause    Estrogen deficiency    Family history of premature CAD    Intertrigo    Skin lesion     Vitamin deficiency     This visit was a preventative care visit, also known as wellness visit or routine physical.   Topics typically include healthy lifestyle, diet, exercise, preventative care, vaccinations, sick and well care, proper use of emergency dept and after hours care, as well as other concerns.     Recommendations: Continue to return yearly for your annual wellness and preventative care visits.  This gives [place] a chance to discuss healthy lifestyle, exercise, vaccinations, review your chart record, and perform screenings where appropriate.  I recommend you see your eye doctor yearly for routine vision care.  I recommend you see your dentist yearly for routine dental care including hygiene visits twice yearly.   Vaccination recommendations were reviewed Immunization History  Administered Date(s) Administered   Influenza,inj,Quad PF,6+ Mos 06/24/2021, 07/22/2022   PFIZER(Purple Top)SARS-COV-2 Vaccination 11/30/2019, 12/21/2019, 09/05/2020   PNEUMOCOCCAL CONJUGATE-20 12/31/2020   Tdap 06/06/2021   Zoster Recombinat (Shingrix) 09/22/2014, 06/06/2021     Screening for cancer: Colon cancer screening: March 2023 colonoscopy showing diverticulitis, otherwise normal, repeat in 10 years  Breast cancer screening: You should perform a self breast exam monthly.   We reviewed recommendations for regular mammograms and breast cancer screening.  Reviewed 10/2021 normal mammogram.  Continue routine screening yearly  Cervical cancer screening: Status post hysterectomy  Skin cancer screening: Check your skin regularly for new changes, growing lesions, or other lesions of concern Come in for evaluation if you have skin lesions of concern.  Lung cancer screening: If you have a greater than 20 pack year history of tobacco use, then you may qualify for lung cancer screening with a chest CT scan.   Please call your insurance company to inquire about coverage for this test.  We currently  don't have screenings for other cancers besides breast, cervical, colon, and lung cancers.  If you have a strong family history of cancer or have other cancer screening concerns, please let me know.    Bone health: Get at least 150 minutes of aerobic exercise weekly Get weight bearing exercise at least once weekly Bone density test:  A bone density test is an imaging test that uses a type of X-ray to measure the amount of calcium and other minerals in your bones. The test may be used to diagnose or screen you for a condition that causes weak or thin bones (osteoporosis), predict your risk for a broken  bone (fracture), or determine how well your osteoporosis treatment is working. The bone density test is recommended for females 100 and older, or females or males [id number] if certain risk factors such as thyroid disease, long term use of steroids such as for asthma or rheumatological issues, vitamin D deficiency, estrogen deficiency, family history of osteoporosis, self or family history of fragility fracture in first degree relative.  Baseline bone density test was normal July 2023   Heart health: Get at least 150 minutes of aerobic exercise weekly Limit alcohol It is important to maintain a healthy blood pressure and healthy cholesterol numbers  Heart disease screening: Screening for heart disease includes screening for blood pressure, fasting lipids, glucose/diabetes screening, BMI height to weight ratio, reviewed of smoking status, physical activity, and diet.    Goals include blood pressure 120/80 or less, maintaining a healthy lipid/cholesterol profile, preventing diabetes or keeping diabetes numbers under good control, not smoking or using tobacco products, exercising most days per week or at least 150 minutes per week of exercise, and eating healthy variety of fruits and vegetables, healthy oils, and avoiding unhealthy food choices like fried food, fast food, high sugar and high cholesterol  foods.     CT coronary test showed coronary artery disease and aortic atherosclerosis last year April 2023   Medical care options: I recommend you continue to seek care here first for routine care.  We try really hard to have available appointments Monday through Friday daytime hours for sick visits, acute visits, and physicals.  Urgent care should be used for after hours and weekends for significant issues that cannot wait till the next day.  The emergency department should be used for significant potentially life-threatening emergencies.  The emergency department is expensive, can often have long wait times for less significant concerns, so try to utilize primary care, urgent care, or telemedicine when possible to avoid unnecessary trips to the emergency department.  Virtual visits and telemedicine have been introduced since the pandemic started in 2020, and can be convenient ways to receive medical care.  We offer virtual appointments as well to assist you in a variety of options to seek medical care.    Advanced Directives: I recommend you consider completing a [place] and Living Will.   These documents respect your wishes and help alleviate burdens on your loved ones if you were to become terminally ill or be in a position to need those documents enforced.    You can complete Advanced Directives yourself, have them notarized, then have copies made for our office, for you and for anybody you feel should have them in safe keeping.  Or, you can have an attorney prepare these documents.   If you haven't updated your Last Will and Testament in a while, it may be worthwhile having an attorney prepare these documents together and save on some costs.       Separate significant issues discussed: Diabetes Updated labs today Continue glucose monitoring, goal 130 glucose or less in the mornings fasting Continue yearly eye doctor visits and make sure they send [place] a copy of the  diabetic eye exam Check your feet daily for sores or wounds  Hypertension  Continue telmisartan HCT 80/12.5 mg daily  Hyperlipidemia  Updated labs to follow Continue Crestor 40 mg daily, aspirin 81 mg daily  Sinusitis Begin Biaxin antibiotic twice daily for a week Consider nasal saline flush Consider over-the-counter Mucinex for the next 5 days  Intertrigo rash Continue nystatin  powder another 1 to 2 weeks Begin Lotrisone cream topically at nighttime under the breast for the next week and a half  Skin lesion under left breast likely wart Return at your convenience for treatment  Sleep apnea New diagnosis 2023 Continue CPAP therapy Work on efforts to lose weight  Audrey King was seen today for annual exam.  Diagnoses and all orders for this visit:  Encounter for health maintenance examination in adult -     Comprehensive metabolic panel -     CBC -     Lipid panel -     Hemoglobin A1c -     Microalbumin/Creatinine Ratio, Urine -     VITAMIN D 25 Hydroxy (Vit-D Deficiency, Fractures)  OSA (obstructive sleep apnea)  Essential hypertension, benign  Hyperlipidemia associated with type 2 diabetes mellitus ([place]) -     Lipid panel  Uncontrolled type 2 diabetes mellitus with hyperglycemia (HCC) -     Hemoglobin A1c -     Microalbumin/Creatinine Ratio, Urine  Vaccine counseling  Surgical menopause  Estrogen deficiency  Family history of premature CAD  Intertrigo  Skin lesion -     VITAMIN D 25 Hydroxy (Vit-D Deficiency, Fractures)  Vitamin deficiency -     VITAMIN D 25 Hydroxy (Vit-D Deficiency, Fractures)    Follow-up pending labs, yearly for physical

## 2022-11-11 NOTE — Addendum Note (Signed)
Addended by: Minette Headland A on: 11/11/2022 09:32 AM   Modules accepted: Orders

## 2022-11-11 NOTE — Patient Instructions (Signed)
This visit was a preventative care visit, also known as wellness visit or routine physical.   Topics typically include healthy lifestyle, diet, exercise, preventative care, vaccinations, sick and well care, proper use of emergency dept and after hours care, as well as other concerns.     Recommendations: Continue to return yearly for your annual wellness and preventative care visits.  This gives [place] a chance to discuss healthy lifestyle, exercise, vaccinations, review your chart record, and perform screenings where appropriate.  I recommend you see your eye doctor yearly for routine vision care.  I recommend you see your dentist yearly for routine dental care including hygiene visits twice yearly.   Vaccination recommendations were reviewed Immunization History  Administered Date(s) Administered   Influenza,inj,Quad PF,6+ Mos 06/24/2021, 07/22/2022   PFIZER(Purple Top)SARS-COV-2 Vaccination 11/30/2019, 12/21/2019, 09/05/2020   PNEUMOCOCCAL CONJUGATE-20 12/31/2020   Tdap 06/06/2021   Zoster Recombinat (Shingrix) 09/22/2014, 06/06/2021     Screening for cancer: Colon cancer screening: March 2023 colonoscopy showing diverticulitis, otherwise normal, repeat in 10 years  Breast cancer screening: You should perform a self breast exam monthly.   We reviewed recommendations for regular mammograms and breast cancer screening.  Reviewed 10/2021 normal mammogram.  Continue routine screening yearly  Cervical cancer screening: Status post hysterectomy  Skin cancer screening: Check your skin regularly for new changes, growing lesions, or other lesions of concern Come in for evaluation if you have skin lesions of concern.  Lung cancer screening: If you have a greater than 20 pack year history of tobacco use, then you may qualify for lung cancer screening with a chest CT scan.   Please call your insurance company to inquire about coverage for this test.  We currently don't have screenings for  other cancers besides breast, cervical, colon, and lung cancers.  If you have a strong family history of cancer or have other cancer screening concerns, please let me know.    Bone health: Get at least 150 minutes of aerobic exercise weekly Get weight bearing exercise at least once weekly Bone density test:  A bone density test is an imaging test that uses a type of X-ray to measure the amount of calcium and other minerals in your bones. The test may be used to diagnose or screen you for a condition that causes weak or thin bones (osteoporosis), predict your risk for a broken bone (fracture), or determine how well your osteoporosis treatment is working. The bone density test is recommended for females 80 and older, or females or males [id number] if certain risk factors such as thyroid disease, long term use of steroids such as for asthma or rheumatological issues, vitamin D deficiency, estrogen deficiency, family history of osteoporosis, self or family history of fragility fracture in first degree relative.  Baseline bone density test was normal July 2023   Heart health: Get at least 150 minutes of aerobic exercise weekly Limit alcohol It is important to maintain a healthy blood pressure and healthy cholesterol numbers  Heart disease screening: Screening for heart disease includes screening for blood pressure, fasting lipids, glucose/diabetes screening, BMI height to weight ratio, reviewed of smoking status, physical activity, and diet.    Goals include blood pressure 120/80 or less, maintaining a healthy lipid/cholesterol profile, preventing diabetes or keeping diabetes numbers under good control, not smoking or using tobacco products, exercising most days per week or at least 150 minutes per week of exercise, and eating healthy variety of fruits and vegetables, healthy oils, and avoiding unhealthy food choices like  fried food, fast food, high sugar and high cholesterol foods.     CT coronary test  showed coronary artery disease and aortic atherosclerosis last year April 2023   Medical care options: I recommend you continue to seek care here first for routine care.  We try really hard to have available appointments Monday through Friday daytime hours for sick visits, acute visits, and physicals.  Urgent care should be used for after hours and weekends for significant issues that cannot wait till the next day.  The emergency department should be used for significant potentially life-threatening emergencies.  The emergency department is expensive, can often have long wait times for less significant concerns, so try to utilize primary care, urgent care, or telemedicine when possible to avoid unnecessary trips to the emergency department.  Virtual visits and telemedicine have been introduced since the pandemic started in 2020, and can be convenient ways to receive medical care.  We offer virtual appointments as well to assist you in a variety of options to seek medical care.    Advanced Directives: I recommend you consider completing a [place] and Living Will.   These documents respect your wishes and help alleviate burdens on your loved ones if you were to become terminally ill or be in a position to need those documents enforced.    You can complete Advanced Directives yourself, have them notarized, then have copies made for our office, for you and for anybody you feel should have them in safe keeping.  Or, you can have an attorney prepare these documents.   If you haven't updated your Last Will and Testament in a while, it may be worthwhile having an attorney prepare these documents together and save on some costs.       Separate significant issues discussed: Diabetes Updated labs today Continue glucose monitoring, goal 130 glucose or less in the mornings fasting Continue yearly eye doctor visits and make sure they send [place] a copy of the diabetic eye exam Check your  feet daily for sores or wounds  Hypertension  Continue telmisartan HCT 80/12.5 mg daily  Hyperlipidemia  Updated labs to follow Continue Crestor 40 mg daily, aspirin 81 mg daily  Sinusitis Begin Biaxin antibiotic twice daily for a week Consider nasal saline flush Consider over-the-counter Mucinex for the next 5 days  Intertrigo rash Continue nystatin powder another 1 to 2 weeks Begin Lotrisone cream topically at nighttime under the breast for the next week and a half  Skin lesion under left breast likely wart Return at your convenience for treatment  Sleep apnea New diagnosis 2023 Continue CPAP therapy Work on efforts to lose weight

## 2022-11-12 ENCOUNTER — Other Ambulatory Visit: Payer: Self-pay | Admitting: Medical

## 2022-11-12 DIAGNOSIS — E785 Hyperlipidemia, unspecified: Secondary | ICD-10-CM

## 2022-11-12 LAB — CBC
Hematocrit: 35.7 % (ref 34.0–46.6)
Hemoglobin: 11.9 g/dL (ref 11.1–15.9)
MCH: 30.8 pg (ref 26.6–33.0)
MCHC: 33.3 g/dL (ref 31.5–35.7)
MCV: 93 fL (ref 79–97)
Platelets: 210 10*3/uL (ref 150–450)
RBC: 3.86 x10E6/uL (ref 3.77–5.28)
RDW: 13.3 % (ref 11.7–15.4)
WBC: 10.2 10*3/uL (ref 3.4–10.8)

## 2022-11-12 LAB — MICROALBUMIN / CREATININE URINE RATIO
Creatinine, Urine: 119.3 mg/dL
Microalb/Creat Ratio: 39 mg/g creat — ABNORMAL HIGH (ref 0–29)
Microalbumin, Urine: 46 ug/mL

## 2022-11-12 LAB — LIPID PANEL
Chol/HDL Ratio: 2.4 ratio (ref 0.0–4.4)
Cholesterol, Total: 102 mg/dL (ref 100–199)
HDL: 43 mg/dL
LDL Chol Calc (NIH): 38 mg/dL (ref 0–99)
Triglycerides: 113 mg/dL (ref 0–149)
VLDL Cholesterol Cal: 21 mg/dL (ref 5–40)

## 2022-11-12 LAB — COMPREHENSIVE METABOLIC PANEL WITH GFR
ALT: 37 [IU]/L — ABNORMAL HIGH (ref 0–32)
AST: 28 [IU]/L (ref 0–40)
Albumin/Globulin Ratio: 2.2 (ref 1.2–2.2)
Albumin: 4.4 g/dL (ref 3.8–4.9)
Alkaline Phosphatase: 42 [IU]/L — ABNORMAL LOW (ref 44–121)
BUN/Creatinine Ratio: 31 — ABNORMAL HIGH (ref 9–23)
BUN: 26 mg/dL — ABNORMAL HIGH (ref 6–24)
Bilirubin Total: 0.7 mg/dL (ref 0.0–1.2)
CO2: 22 mmol/L (ref 20–29)
Calcium: 10.2 mg/dL (ref 8.7–10.2)
Chloride: 99 mmol/L (ref 96–106)
Creatinine, Ser: 0.83 mg/dL (ref 0.57–1.00)
Globulin, Total: 2 g/dL (ref 1.5–4.5)
Glucose: 186 mg/dL — ABNORMAL HIGH (ref 70–99)
Potassium: 4 mmol/L (ref 3.5–5.2)
Sodium: 137 mmol/L (ref 134–144)
Total Protein: 6.4 g/dL (ref 6.0–8.5)
eGFR: 82 mL/min/{1.73_m2}

## 2022-11-12 LAB — VITAMIN D 25 HYDROXY (VIT D DEFICIENCY, FRACTURES): Vit D, 25-Hydroxy: 39.8 ng/mL (ref 30.0–100.0)

## 2022-11-12 LAB — HEMOGLOBIN A1C
Est. average glucose Bld gHb Est-mCnc: 180 mg/dL
Hgb A1c MFr Bld: 7.9 % — ABNORMAL HIGH (ref 4.8–5.6)

## 2022-11-12 MED ORDER — RYBELSUS 14 MG PO TABS: 1.0000 | ORAL_TABLET | Freq: Every day | ORAL | 0 refills | Status: DC

## 2022-11-12 MED ORDER — INSULIN DEGLUDEC FLEXTOUCH 100 UNIT/ML ~~LOC~~ SOPN: PEN_INJECTOR | SUBCUTANEOUS | 5 refills | Status: DC

## 2022-11-12 MED ORDER — ROSUVASTATIN CALCIUM 40 MG PO TABS: 40.0000 mg | ORAL_TABLET | Freq: Every day | ORAL | 3 refills | Status: DC

## 2022-11-12 MED ORDER — ASPIRIN 81 MG PO TBEC: 81.0000 mg | DELAYED_RELEASE_TABLET | Freq: Every day | ORAL | 3 refills | Status: DC

## 2022-11-12 MED ORDER — TELMISARTAN-HCTZ 80-12.5 MG PO TABS: 1.0000 | ORAL_TABLET | Freq: Every day | ORAL | 3 refills | Status: DC

## 2022-11-12 MED ORDER — VITAMIN D 50 MCG (2000 UT) PO CAPS: 1.0000 | ORAL_CAPSULE | Freq: Every day | ORAL | 3 refills | Status: DC

## 2022-11-12 NOTE — Progress Notes (Signed)
Results sent through My Chart  Schedule 38-monthfasting med check

## 2022-12-23 ENCOUNTER — Ambulatory Visit
Admission: RE | Admit: 2022-12-23 | Discharge: 2022-12-23 | Disposition: A | Discharge status: Home / Self Care | Payer: Commercial Managed Care - PPO | Source: Ambulatory Visit | Attending: Medical | Admitting: Medical

## 2022-12-23 DIAGNOSIS — Z1231 Encounter for screening mammogram for malignant neoplasm of breast: Secondary | ICD-10-CM

## 2022-12-24 NOTE — Progress Notes (Signed)
Results sent through MyChart

## 2023-02-17 ENCOUNTER — Ambulatory Visit: Payer: Commercial Managed Care - PPO | Attending: Internal Medicine | Admitting: Internal Medicine

## 2023-02-17 ENCOUNTER — Encounter: Payer: Self-pay | Admitting: Internal Medicine

## 2023-02-17 VITALS — BP 118/73 | HR 73 | Ht 64.0 in | Wt 191.6 lb

## 2023-02-17 DIAGNOSIS — I4510 Unspecified right bundle-branch block: Secondary | ICD-10-CM | POA: Diagnosis not present

## 2023-02-17 DIAGNOSIS — I2510 Atherosclerotic heart disease of native coronary artery without angina pectoris: Secondary | ICD-10-CM

## 2023-02-17 DIAGNOSIS — E1165 Type 2 diabetes mellitus with hyperglycemia: Secondary | ICD-10-CM

## 2023-02-17 DIAGNOSIS — E785 Hyperlipidemia, unspecified: Secondary | ICD-10-CM

## 2023-02-17 DIAGNOSIS — G4733 Obstructive sleep apnea (adult) (pediatric): Secondary | ICD-10-CM

## 2023-02-17 DIAGNOSIS — Z794 Long term (current) use of insulin: Secondary | ICD-10-CM

## 2023-02-17 NOTE — Patient Instructions (Signed)
Medication Instructions:  NO CHANGES  *If you need a refill on your cardiac medications before your next appointment, please call your pharmacy*   Lab Work: FASTING lab work to check cholesterol in 6 months  If you have labs (blood work) drawn today and your tests are completely normal, you will receive your results only by: MyChart Message (if you have MyChart) OR A paper copy in the mail If you have any lab test that is abnormal or we need to change your treatment, we will call you to review the results.   Follow-Up: At [facility], you and your health needs are our priority.  As part of our continuing mission to provide you with exceptional heart care, we have created designated Provider Care Teams.  These Care Teams include your primary Cardiologist (physician) and Advanced Practice Providers (APPs -  Physician Assistants and Nurse Practitioners) who all work together to provide you with the care you need, when you need it.  We recommend signing up for the patient portal called "MyChart".  Sign up information is provided on this After Visit Summary.  MyChart is used to connect with patients for Virtual Visits (Telemedicine).  Patients are able to view lab/test results, encounter notes, upcoming appointments, etc.  Non-urgent messages can be sent to your provider as well.   To learn more about what you can do with MyChart, go to [email or web address].    Your next appointment:    6 months with Dr. Hilty 

## 2023-02-17 NOTE — Progress Notes (Signed)
OFFICE CONSULT NOTE  Chief Complaint:  Follow-up   Primary Care Physician: Jac Canavan, PA-C  HPI:  Audrey King is a 57 y.o. female who is being seen today for the evaluation of abnormal EKG at the request of Genia Del.  This is a pleasant 57 year old female kindly referred for evaluation management of an abnormal EKG.  She recently established with a new PCP and an EKG was performed.  This demonstrated normal sinus rhythm with a right bundle branch block at 89.  Other risk factors for heart disease include type 2 diabetes, hypertension and dyslipidemia.  I was informed that she had heart disease in her family including her father apparently had his first heart attack in his 30s.  Since then he has had numerous stents and other cardiac conditions.  With the EKG demonstrating a right bundle branch block, 1 typically thinks about pulmonary pathology such as COPD, obstructive sleep apnea or pulmonary hypertension.  She does have risk factors for sleep apnea and after performing a questionnaire today her Epworth Sleepiness Scale score was 10.  Weight is 200 pounds with a BMI of 35 and neck thickness greater than 17 inches.  Blood pressure was noted to be somewhat low normal which she says is typical for her and she is on medication for this last lipid profile showed total cholesterol 148, triglycerides 116 HDL 41 and LDL 86.  She does report fatigue and does not note any shortness of breath or chest pain with exertion.  Diabetes has been uncontrolled with recent hemoglobin A1c of 10.4%.  02/10/2022  Audrey King returns today for follow-up of her testing.  She underwent a sleep study that was interpreted by Dr. Daphene Jaeger, this indicated severe sleep apnea with an AHI of 31.9 events per hour, worse at 40.6/h in supine sleep.  Based on these findings, CPAP was recommended.  This is in progress.  She also underwent CT coronary angiography.  This demonstrated at least moderate  multivessel coronary disease and a calcium score 337, 98th percentile for age and sex matched control.  Aortic atherosclerosis and enlarged pulmonary artery were noted.  FFR was performed and was negative therefore no obstructive disease was suspected.  She returns today and says she is feeling fairly well.  She has tolerated increased dose of rosuvastatin now 40 mg daily.  Her total cholesterol is much lower at 108, HDL 39, triglycerides 85 and LDL 52.  This is at target.  Her blood pressure is also well controlled at 120/78.  She is working on lowering her A1c which was previously 10.4%.  She has a follow-up with a diabetes educator on June 12.  02/17/2023  Audrey King returns today for follow-up.  Overall her laboratory work seems significantly improved.  He A1c is down to 7.9 and have progressively decreased by about 1% every few months over the past several years.  Her vitamin D level is now normal at 39.8.  Her cholesterol has improved significantly as well.  Total 102, triglycerides 113, HDL 43 and LDL 38.  She is on high-dose rosuvastatin 40 mg daily.  She is on Rybelsus and has had a minimal amount of weight loss.  EKG is stable with a right bundle branch block.  No chest pain or shortness of breath.  She has had some issues with her CPAP.  She used it pretty regularly for about a year but now says she gets some anxiety wearing it and feels like it is causing  a lot of pressure and difficulty sleeping.  PMHx:  Past Medical History:  Diagnosis Date   Allergy    Chronic back pain    Diabetes mellitus without complication (HCC)    Hyperlipidemia    Hypertension     Past Surgical History:  Procedure Laterality Date   CESAREAN SECTION     TOTAL ABDOMINAL HYSTERECTOMY W/ BILATERAL SALPINGOOPHORECTOMY  2010    FAMHx:  Family History  Problem Relation Age of Onset   Hypertension Mother    Diabetes Mother    Heart disease Father 20       MI multiple, stents, CAD   Kidney disease Father     Hypertension Father    Diabetes Father    Cancer Maternal Aunt        colon   Breast cancer Paternal Aunt    Cancer Paternal Aunt        breast   Breast cancer Paternal Grandmother    Cancer Paternal Grandmother        breast    SOCHx:   reports that she has never smoked. She has never used smokeless tobacco. She reports that she does not drink alcohol and does not use drugs.  ALLERGIES:  Allergies  Allergen Reactions   Gabapentin     Crying spells and head fuzzy feeling   Metformin And Related     Nausea and vomiting within 30 min of eating   Penicillins Swelling    Has patient had a PCN reaction causing immediate rash, facial/tongue/throat swelling, SOB or lightheadedness with hypotension:YES Has patient had a PCN reaction causing severe rash involving mucus membranes or skin necrosis: NO Has patient had a PCN reaction that required hospitalization NO Has patient had a PCN reaction occurring within the last 10 years: NO If all of the above answers are "NO", then may proceed with Cephalosporin use.    ROS: Pertinent items noted in HPI and remainder of comprehensive ROS otherwise negative.  HOME MEDS: Current Outpatient Medications on File Prior to Visit  Medication Sig Dispense Refill   aspirin EC 81 MG tablet Take 1 tablet (81 mg total) by mouth daily. Swallow whole. 90 tablet 3   cetirizine (ZYRTEC) 10 MG tablet Take 10 mg by mouth daily.     Cholecalciferol (VITAMIN D) 50 MCG (2000 UT) CAPS Take 1 capsule (2,000 Units total) by mouth daily. 90 capsule 3   Insulin Degludec FlexTouch 100 UNIT/ML SOPN INJECT 52 UNITS INTO THE SKIN DAILY AT 10 PM 15 mL 5   Multiple Vitamin (MULTIVITAMIN) tablet Take 1 tablet by mouth daily.     omeprazole (PRILOSEC) 20 MG capsule Take 20 mg by mouth daily.     rosuvastatin (CRESTOR) 40 MG tablet Take 1 tablet (40 mg total) by mouth daily. 90 tablet 3   Semaglutide (RYBELSUS) 14 MG TABS Take 1 tablet (14 mg total) by mouth daily. 90  tablet 0   telmisartan-hydrochlorothiazide (MICARDIS HCT) 80-12.5 MG tablet Take 1 tablet by mouth daily. telmisartan 80 mg-hydrochlorothiazide 12.5 mg tablet 90 tablet 3   No current facility-administered medications on file prior to visit.    LABS/IMAGING: No results found for this or any previous visit (from the past 48 hour(s)). No results found.  LIPID PANEL:    Component Value Date/Time   CHOL 102 11/11/2022 0903   TRIG 113 11/11/2022 0903   HDL 43 11/11/2022 0903   CHOLHDL 2.4 11/11/2022 0903   LDLCALC 38 11/11/2022 0903    WEIGHTS: Wt Readings  from Last 3 Encounters:  02/17/23 191 lb 9.6 oz (86.9 kg)  11/11/22 194 lb 3.2 oz (88.1 kg)  10/22/22 194 lb 12.8 oz (88.4 kg)    VITALS: BP 118/73   Pulse 73   Ht 5\' 4"  (1.626 m)   Wt 191 lb 9.6 oz (86.9 kg)   SpO2 99%   BMI 32.89 kg/m   EXAM: General appearance: alert, no distress, and moderately obese Neck: no carotid bruit, no JVD, and thyroid not enlarged, symmetric, no tenderness/mass/nodules Lungs: clear to auscultation bilaterally Heart: regular rate and rhythm, S1, S2 normal, no murmur, click, rub or gallop Abdomen: soft, non-tender; bowel sounds normal; no masses,  no organomegaly Extremities: extremities normal, atraumatic, no cyanosis or edema Pulses: 2+ and symmetric Skin: Skin color, texture, turgor normal. No rashes or lesions Neurologic: Grossly normal Psych: Pleasant   EKG: Normal sinus rhythm at 73, RBBB - personally reviewed  ASSESSMENT: Type 2 diabetes-uncontrolled, A1c 10.4% -> improved to 7.9% RBBB CAD with CAC score 337, 98th percentile for age and sex matched controls (12/2021) Hypertension Dyslipidemia, goal LDL less than 70 Family history of premature coronary disease in her father Severe OSA  PLAN: 1.   Audrey King seems to be improving with some modest weight loss, marked improvement in her A1c and her dyslipidemia.  Her blood pressure is well-controlled.  She had been compliant  with CPAP for the past year but recently has been more anxious about it and feels like she has difficulty sleeping with it.  She may need a different type of mask set up or pressure settings.  I will refer her back to our sleep coordinator to see if she can have a titration.  Plan follow-up with me in 6 months with a repeat lipid profile.  Chrystie Nose, MD, [facility], FACP  [place]  [facility]  Medical Director of the [facility]  Cardiovascular Risk Reduction [facility] Diplomate of the American Board of Clinical Lipidology Attending Cardiologist  Direct Dial: [phone]  Fax: [phone]  Website:  [email or web address]  Lisette Abu Emmarose Klinke 02/17/2023, 8:20 AM

## 2023-03-16 ENCOUNTER — Ambulatory Visit: Payer: Commercial Managed Care - PPO | Admitting: Medical

## 2023-03-16 ENCOUNTER — Telehealth: Payer: Self-pay

## 2023-03-16 NOTE — Telephone Encounter (Signed)
Spoke with patient. Patient stated, "It feels like I can not catch my breath with the CPAP at night." Dr. Tresa Endo changed settings to a lowered max pressure of 15 from 20. Patient verbalized understanding and will call back in a few days to let us know if new settings are working for her.

## 2023-04-02 ENCOUNTER — Other Ambulatory Visit: Payer: Self-pay | Admitting: Internal Medicine

## 2023-04-02 MED ORDER — RYBELSUS 14 MG PO TABS: 1.0000 | ORAL_TABLET | Freq: Every day | ORAL | 0 refills | Status: DC

## 2023-04-03 ENCOUNTER — Ambulatory Visit (INDEPENDENT_AMBULATORY_CARE_PROVIDER_SITE_OTHER): Payer: Commercial Managed Care - PPO | Admitting: Medical

## 2023-04-03 VITALS — BP 104/64 | HR 84 | Wt 196.4 lb

## 2023-04-03 DIAGNOSIS — E785 Hyperlipidemia, unspecified: Secondary | ICD-10-CM

## 2023-04-03 DIAGNOSIS — E1169 Type 2 diabetes mellitus with other specified complication: Secondary | ICD-10-CM

## 2023-04-03 DIAGNOSIS — K5900 Constipation, unspecified: Secondary | ICD-10-CM | POA: Insufficient documentation

## 2023-04-03 DIAGNOSIS — I10 Essential (primary) hypertension: Secondary | ICD-10-CM | POA: Diagnosis not present

## 2023-04-03 DIAGNOSIS — E118 Type 2 diabetes mellitus with unspecified complications: Secondary | ICD-10-CM | POA: Diagnosis not present

## 2023-04-03 DIAGNOSIS — R7989 Other specified abnormal findings of blood chemistry: Secondary | ICD-10-CM

## 2023-04-03 DIAGNOSIS — T50905A Adverse effect of unspecified drugs, medicaments and biological substances, initial encounter: Secondary | ICD-10-CM

## 2023-04-03 LAB — HEPATIC FUNCTION PANEL: Total Protein: 6.5 g/dL (ref 6.0–8.5)

## 2023-04-03 LAB — HEMOGLOBIN A1C: Hgb A1c MFr Bld: 8.2 % — ABNORMAL HIGH (ref 4.8–5.6)

## 2023-04-03 NOTE — Progress Notes (Signed)
Subjective:  Audrey King is a 57 y.o. female who presents for Chief Complaint  Patient presents with   Medical Management of Chronic Issues    4 month follow-up on diabetes, has not had eye exam this year     Here for chronic disease follow-up.  Diabetes-compliant with Rybelsus 14 mg daily and insulin degludec 52 units daily.   Checks glucose every other day, seeing 130-140 fasting. No polyuria, no polydipsia, no blurred vision.    Does have constipation issues.  Using benafiber, metamucil, dulcolax.    Hypertension-compliant with telmisartan HCT 80/12.5 mg daily.  BPs run normal at home.    Hyperlipidemia-compliant with rosuvastatin 40 mg daily and aspirin 81 mg daily  She takes vitamin D supplement daily.  No alcohol use.  No other aggravating or relieving factors.    No other c/o.  Past Medical History:  Diagnosis Date   Allergy    Chronic back pain    Diabetes mellitus without complication (HCC)    Hyperlipidemia    Hypertension    Current Outpatient Medications on File Prior to Visit  Medication Sig Dispense Refill   aspirin EC 81 MG tablet Take 1 tablet (81 mg total) by mouth daily. Swallow whole. 90 tablet 3   cetirizine (ZYRTEC) 10 MG tablet Take 10 mg by mouth daily.     Cholecalciferol (VITAMIN D) 50 MCG (2000 UT) CAPS Take 1 capsule (2,000 Units total) by mouth daily. 90 capsule 3   Insulin Degludec FlexTouch 100 UNIT/ML SOPN INJECT 52 UNITS INTO THE SKIN DAILY AT 10 PM 15 mL 5   Multiple Vitamin (MULTIVITAMIN) tablet Take 1 tablet by mouth daily.     omeprazole (PRILOSEC) 20 MG capsule Take 20 mg by mouth daily.     rosuvastatin (CRESTOR) 40 MG tablet Take 1 tablet (40 mg total) by mouth daily. 90 tablet 3   Semaglutide (RYBELSUS) 14 MG TABS Take 1 tablet (14 mg total) by mouth daily. 90 tablet 0   telmisartan-hydrochlorothiazide (MICARDIS HCT) 80-12.5 MG tablet Take 1 tablet by mouth daily. telmisartan 80 mg-hydrochlorothiazide 12.5 mg tablet 90 tablet 3    No current facility-administered medications on file prior to visit.    The following portions of the patient's history were reviewed and updated as appropriate: allergies, current medications, past family history, past medical history, past social history, past surgical history and problem list.  ROS Otherwise as in subjective above    Objective: BP 104/64   Pulse 84   Wt 196 lb 6.4 oz (89.1 kg)   BMI 33.71 kg/m   Wt Readings from Last 3 Encounters:  04/03/23 196 lb 6.4 oz (89.1 kg)  02/17/23 191 lb 9.6 oz (86.9 kg)  11/11/22 194 lb 3.2 oz (88.1 kg)   BP Readings from Last 3 Encounters:  04/03/23 104/64  02/17/23 118/73  11/11/22 110/68    General appearance: alert, no distress, well developed, well nourished Neck: supple, no lymphadenopathy, no thyromegaly, no masses, no bruits Heart: RRR, normal S1, S2, no murmurs Lungs: CTA bilaterally, no wheezes, rhonchi, or rales Pulses: 2+ radial pulses, 2+ pedal pulses, normal cap refill Ext: no edema  Diabetic Foot Exam - Simple   Simple Foot Form Diabetic Foot exam was performed with the following findings: Yes 04/03/2023  8:31 AM  Visual Inspection No deformities, no ulcerations, no other skin breakdown bilaterally: Yes Sensation Testing Intact to touch and monofilament testing bilaterally: Yes Pulse Check Posterior Tibialis and Dorsalis pulse intact bilaterally: Yes Comments  Assessment: Encounter Diagnoses  Name Primary?   Diabetes mellitus type 2 with complications (HCC) Yes   Essential hypertension, benign    Hyperlipidemia associated with type 2 diabetes mellitus (HCC)    Constipation, unspecified constipation type    Adverse effect of drug, initial encounter    Elevated LFTs      Plan: Diabetes type 2-updated labs today.  Currently on insulin degludec and Rybelsus but having some constipation with Rybelsus.  Advise she get her eye doctor visit soon and get [place] a copy of the diabetic eye  exam  Constipation likely due to Rybelsus.  Continue with good water intake, fiber intake.  Consider MiraLAX daily.  Other option would be to lower doses dose or try changing to [facility] or other similar medication.  Hypertension-controlled on current medication, continue telmisartan HCT 80/12.5 mg daily  Hyperlipidemia-currently on rosuvastatin 40 mg.  If liver test still elevated possibly need to decrease dose.  Lipids under control.  She notes that cardiology may decrease dose anyhow if lipids still stay as they were on her February 2024 results  Elevated liver test on last visit, mildly elevated.  Recheck labs today.  If labs still elevated consider abdominal ultrasound, consider other evaluation, and possibly reduce statin dose.  [name] was seen today for medical management of chronic issues.  Diagnoses and all orders for this visit:  Diabetes mellitus type 2 with complications (HCC) -     Hemoglobin A1c  Essential hypertension, benign  Hyperlipidemia associated with type 2 diabetes mellitus (HCC)  Constipation, unspecified constipation type  Adverse effect of drug, initial encounter  Elevated LFTs -     Hepatic function panel    Follow up: pending labs

## 2023-04-04 LAB — HEMOGLOBIN A1C: Est. average glucose Bld gHb Est-mCnc: 189 mg/dL

## 2023-04-04 LAB — HEPATIC FUNCTION PANEL
ALT: 39 IU/L — ABNORMAL HIGH (ref 0–32)
Albumin: 4.5 g/dL (ref 3.8–4.9)
Alkaline Phosphatase: 45 IU/L (ref 44–121)
Bilirubin Total: 0.9 mg/dL (ref 0.0–1.2)
Bilirubin, Direct: 0.24 mg/dL (ref 0.00–0.40)

## 2023-04-06 ENCOUNTER — Other Ambulatory Visit: Payer: Self-pay | Admitting: Medical

## 2023-04-06 MED ORDER — EMPAGLIFLOZIN 10 MG PO TABS: 10.0000 mg | ORAL_TABLET | Freq: Every day | ORAL | 2 refills | Status: DC

## 2023-04-06 NOTE — Progress Notes (Signed)
Results sent through My Chart  Schedule III month fasting follow-up

## 2023-04-09 ENCOUNTER — Telehealth: Payer: Self-pay | Admitting: Medical

## 2023-04-09 NOTE — Telephone Encounter (Signed)
Pt has question about the jardiance Rybelsus , she is making sure that you are adding the jardiance She is asking because pharmacist commented that they are not normally taken together so she wants to make sure she is understanding correctly

## 2023-04-09 NOTE — Telephone Encounter (Signed)
Pt was notified.  

## 2023-05-27 LAB — HM DIABETES EYE EXAM

## 2023-07-01 ENCOUNTER — Other Ambulatory Visit: Payer: Self-pay | Admitting: Medical

## 2023-07-01 NOTE — Telephone Encounter (Signed)
Pt starts new pen tonight but thinks it only last 5 days so sending in a new rx to get her to here appt next week

## 2023-07-08 ENCOUNTER — Ambulatory Visit (INDEPENDENT_AMBULATORY_CARE_PROVIDER_SITE_OTHER): Payer: Commercial Managed Care - PPO | Admitting: Medical

## 2023-07-08 VITALS — BP 110/70 | HR 64 | Wt 188.8 lb

## 2023-07-08 DIAGNOSIS — H269 Unspecified cataract: Secondary | ICD-10-CM

## 2023-07-08 DIAGNOSIS — R7989 Other specified abnormal findings of blood chemistry: Secondary | ICD-10-CM

## 2023-07-08 DIAGNOSIS — E785 Hyperlipidemia, unspecified: Secondary | ICD-10-CM

## 2023-07-08 DIAGNOSIS — I10 Essential (primary) hypertension: Secondary | ICD-10-CM | POA: Diagnosis not present

## 2023-07-08 DIAGNOSIS — R809 Proteinuria, unspecified: Secondary | ICD-10-CM

## 2023-07-08 DIAGNOSIS — E118 Type 2 diabetes mellitus with unspecified complications: Secondary | ICD-10-CM | POA: Diagnosis not present

## 2023-07-08 DIAGNOSIS — E1169 Type 2 diabetes mellitus with other specified complication: Secondary | ICD-10-CM

## 2023-07-08 LAB — COMPREHENSIVE METABOLIC PANEL
ALT: 24 [IU]/L (ref 0–32)
AST: 20 [IU]/L (ref 0–40)
Albumin: 4.6 g/dL (ref 3.8–4.9)
Alkaline Phosphatase: 41 [IU]/L — ABNORMAL LOW (ref 44–121)
BUN/Creatinine Ratio: 38 — ABNORMAL HIGH (ref 9–23)
BUN: 30 mg/dL — ABNORMAL HIGH (ref 6–24)
Bilirubin Total: 0.8 mg/dL (ref 0.0–1.2)
CO2: 26 mmol/L (ref 20–29)
Calcium: 11.1 mg/dL — ABNORMAL HIGH (ref 8.7–10.2)
Chloride: 98 mmol/L (ref 96–106)
Creatinine, Ser: 0.8 mg/dL (ref 0.57–1.00)
Globulin, Total: 2.1 g/dL (ref 1.5–4.5)
Glucose: 93 mg/dL (ref 70–99)
Potassium: 4.4 mmol/L (ref 3.5–5.2)
Sodium: 138 mmol/L (ref 134–144)
Total Protein: 6.7 g/dL (ref 6.0–8.5)
eGFR: 85 mL/min/{1.73_m2} (ref >59)

## 2023-07-08 LAB — HEMOGLOBIN A1C
Est. average glucose Bld gHb Est-mCnc: 137 mg/dL
Hgb A1c MFr Bld: 6.4 % — ABNORMAL HIGH (ref 4.8–5.6)

## 2023-07-08 NOTE — Progress Notes (Signed)
Subjective:  Audrey King is a 57 y.o. female who presents for Chief Complaint  Patient presents with   Medical Management of Chronic Issues    Med check on sugar levels since on new medication. Since July her numbers has been 93-150 (usually under 120)     Here for med check follow-up on diabetes.  She does not tolerate metformin due to nausea and vomiting.  Currently taking Jardiance 10 mg daily.  Currently using Tresiba 52 units daily.  Currently on Rybelsus 14 mg daily.  No particular side effects.  Sugars from August til now mostly under 130 except a few in the 150s.   She has about 3 months of readings with her today.  Compliant with blood pressure medicine telmisartan HCT 80/12.5 mg  Compliant with rosuvastatin Crestor 40 mg daily and aspirin 81 mg daily.  She saw her eye doctor recently.  No diabetic retinopathy but does have some mild small cataracts  Not exercising  No other aggravating or relieving factors.    No other c/o.  Past Medical History:  Diagnosis Date   Allergy    Chronic back pain    Diabetes mellitus without complication (HCC)    Hyperlipidemia    Hypertension    Current Outpatient Medications on File Prior to Visit  Medication Sig Dispense Refill   aspirin EC 81 MG tablet Take 1 tablet (81 mg total) by mouth daily. Swallow whole. 90 tablet 3   cetirizine (ZYRTEC) 10 MG tablet Take 10 mg by mouth daily.     Cholecalciferol (VITAMIN D) 50 MCG (2000 UT) CAPS Take 1 capsule (2,000 Units total) by mouth daily. 90 capsule 3   empagliflozin (JARDIANCE) 10 MG TABS tablet Take 1 tablet (10 mg total) by mouth daily before breakfast. 30 tablet 2   FIBER COMPLETE PO Take by mouth.     Multiple Vitamin (MULTIVITAMIN) tablet Take 1 tablet by mouth daily.     omeprazole (PRILOSEC) 20 MG capsule Take 20 mg by mouth daily.     Probiotic Product (PROBIOTIC BLEND PO) Take by mouth.     rosuvastatin (CRESTOR) 40 MG tablet Take 1 tablet (40 mg total) by mouth daily.  90 tablet 3   Semaglutide (RYBELSUS) 14 MG TABS Take 1 tablet (14 mg total) by mouth daily. 90 tablet 0   telmisartan-hydrochlorothiazide (MICARDIS HCT) 80-12.5 MG tablet Take 1 tablet by mouth daily. telmisartan 80 mg-hydrochlorothiazide 12.5 mg tablet 90 tablet 3   TRESIBA FLEXTOUCH 100 UNIT/ML FlexTouch Pen INJECT 52 UNITS INTO THE SKIN DAILY AT 10PM 15 mL 0   No current facility-administered medications on file prior to visit.    The following portions of the patient's history were reviewed and updated as appropriate: allergies, current medications, past family history, past medical history, past social history, past surgical history and problem list.  ROS Otherwise as in subjective above   Objective: BP 110/70   Pulse 64   Wt 188 lb 12.8 oz (85.6 kg)   BMI 32.41 kg/m   Wt Readings from Last 3 Encounters:  07/08/23 188 lb 12.8 oz (85.6 kg)  04/03/23 196 lb 6.4 oz (89.1 kg)  02/17/23 191 lb 9.6 oz (86.9 kg)   General appearance: alert, no distress, well developed, well nourished Neck: supple, no lymphadenopathy, no thyromegaly, no masses Heart: RRR, normal S1, S2, no murmurs Lungs: CTA bilaterally, no wheezes, rhonchi, or rales Abdomen: +bs, soft, non tender, non distended, no masses, no hepatomegaly, no splenomegaly Pulses: 2+ radial pulses, 2+  pedal pulses, normal cap refill Ext: no edema   Assessment: Encounter Diagnoses  Name Primary?   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  I challenged her to start exercising regularly.  Continue eating healthy.  Continue glucose monitoring.  Continue current therapy including Jardiance 10 mg daily, Rybelsus 14 mg daily, Tresiba 52 units daily  We discussed her  finding of mild microalbuminuria.  Counseled on avoiding NSAIDs, avoid dehydration, hydrate well every day  HTN - continue current medication Telmisartan HCT 80/12.5mg  daily, at goal/    Hyperlipidemia - continue Crestor 40mg  and Asprin 18mg  daily.  Last lipids at goal back in 01/2023  We will have her sign for records from recent eye doctor visit  Prior elevated liver test-we discussed possible causes.  Labs as below  Audrey King was seen today for medical management of chronic issues.  Diagnoses and all orders for this visit:  Diabetes mellitus type 2 with complications (HCC) -     Comprehensive metabolic panel -     Hemoglobin A1c  Essential hypertension, benign -     Comprehensive metabolic panel  Hyperlipidemia associated with type 2 diabetes mellitus (HCC)  Microalbuminuria -     Comprehensive metabolic panel  Elevated LFTs -     Hepatitis B surface antigen -     Iron  Cataract, unspecified cataract type, unspecified laterality   Follow up: pending labs

## 2023-07-09 ENCOUNTER — Other Ambulatory Visit: Payer: Self-pay | Admitting: Medical

## 2023-07-09 DIAGNOSIS — E785 Hyperlipidemia, unspecified: Secondary | ICD-10-CM

## 2023-07-09 LAB — HEPATITIS B SURFACE ANTIGEN: Hepatitis B Surface Ag: NEGATIVE

## 2023-07-09 LAB — IRON: Iron: 66 ug/dL (ref 27–159)

## 2023-07-09 MED ORDER — EMPAGLIFLOZIN 10 MG PO TABS: 10.0000 mg | ORAL_TABLET | Freq: Every day | ORAL | 3 refills | Status: DC

## 2023-07-09 MED ORDER — TRESIBA FLEXTOUCH 100 UNIT/ML ~~LOC~~ SOPN: PEN_INJECTOR | SUBCUTANEOUS | 11 refills | Status: DC

## 2023-07-09 MED ORDER — RYBELSUS 14 MG PO TABS: 1.0000 | ORAL_TABLET | Freq: Every day | ORAL | 1 refills | Status: DC

## 2023-07-09 NOTE — Progress Notes (Signed)
Results sent through My Chart  Schedule 4 month follow-up

## 2023-07-10 ENCOUNTER — Other Ambulatory Visit: Payer: Self-pay | Admitting: Medical

## 2023-07-10 NOTE — Progress Notes (Signed)
Please call the office and schedule an appointment to recheck labs in 4 months  Thanks Vernona Rieger

## 2023-07-15 ENCOUNTER — Ambulatory Visit: Payer: Commercial Managed Care - PPO | Admitting: Medical

## 2023-07-20 ENCOUNTER — Telehealth: Payer: Self-pay | Admitting: Internal Medicine

## 2023-07-20 NOTE — Telephone Encounter (Signed)
Pt called and states that you recommend her going to vitamin D 600mg . She is having trouble finding just the 600mg . She saw were there is a calicum with Vitamin D3 600mg  in it. Would that work or do you want just plain vitamin D

## 2023-07-21 NOTE — Telephone Encounter (Signed)
Pt was notified.  

## 2023-07-30 ENCOUNTER — Encounter: Payer: Self-pay | Admitting: Internal Medicine

## 2023-08-01 ENCOUNTER — Other Ambulatory Visit: Payer: Self-pay | Admitting: Medical

## 2023-08-24 ENCOUNTER — Encounter: Payer: Self-pay | Admitting: Internal Medicine

## 2023-08-24 ENCOUNTER — Ambulatory Visit: Payer: Commercial Managed Care - PPO | Attending: Internal Medicine | Admitting: Internal Medicine

## 2023-08-24 VITALS — BP 104/66 | HR 77 | Ht 64.0 in | Wt 189.4 lb

## 2023-08-24 DIAGNOSIS — I10 Essential (primary) hypertension: Secondary | ICD-10-CM | POA: Diagnosis not present

## 2023-08-24 DIAGNOSIS — I4510 Unspecified right bundle-branch block: Secondary | ICD-10-CM | POA: Diagnosis not present

## 2023-08-24 DIAGNOSIS — E785 Hyperlipidemia, unspecified: Secondary | ICD-10-CM | POA: Diagnosis not present

## 2023-08-24 DIAGNOSIS — E119 Type 2 diabetes mellitus without complications: Secondary | ICD-10-CM

## 2023-08-24 DIAGNOSIS — I2510 Atherosclerotic heart disease of native coronary artery without angina pectoris: Secondary | ICD-10-CM

## 2023-08-24 NOTE — Patient Instructions (Signed)
Medication Instructions:  NO CHANGES  *If you need a refill on your cardiac medications before your next appointment, please call your pharmacy*   Lab Work: Lipid Panel today   If you have labs (blood work) drawn today and your tests are completely normal, you will receive your results only by: MyChart Message (if you have MyChart) OR A paper copy in the mail If you have any lab test that is abnormal or we need to change your treatment, we will call you to review the results.   Follow-Up: At [facility], you and your health needs are our priority.  As part of our continuing mission to provide you with exceptional heart care, we have created designated Provider Care Teams.  These Care Teams include your primary Cardiologist (physician) and Advanced Practice Providers (APPs -  Physician Assistants and Nurse Practitioners) who all work together to provide you with the care you need, when you need it.  We recommend signing up for the patient portal called "MyChart".  Sign up information is provided on this After Visit Summary.  MyChart is used to connect with patients for Virtual Visits (Telemedicine).  Patients are able to view lab/test results, encounter notes, upcoming appointments, etc.  Non-urgent messages can be sent to your provider as well.   To learn more about what you can do with MyChart, go to [email or web address].    Your next appointment:    12 months with Dr. Rennis Golden

## 2023-08-24 NOTE — Progress Notes (Signed)
OFFICE CONSULT NOTE  Chief Complaint:  Follow-up   Primary Care Physician: Jac Canavan, PA-C  HPI:  Audrey King is a 57 y.o. female who is being seen today for the evaluation of abnormal EKG at the request of Genia Del.  This is a pleasant 57 year old female kindly referred for evaluation management of an abnormal EKG.  She recently established with a new PCP and an EKG was performed.  This demonstrated normal sinus rhythm with a right bundle branch block at 89.  Other risk factors for heart disease include type 2 diabetes, hypertension and dyslipidemia.  I was informed that she had heart disease in her family including her father apparently had his first heart attack in his 30s.  Since then he has had numerous stents and other cardiac conditions.  With the EKG demonstrating a right bundle branch block, 1 typically thinks about pulmonary pathology such as COPD, obstructive sleep apnea or pulmonary hypertension.  She does have risk factors for sleep apnea and after performing a questionnaire today her Epworth Sleepiness Scale score was 10.  Weight is 200 pounds with a BMI of 35 and neck thickness greater than 17 inches.  Blood pressure was noted to be somewhat low normal which she says is typical for her and she is on medication for this last lipid profile showed total cholesterol 148, triglycerides 116 HDL 41 and LDL 86.  She does report fatigue and does not note any shortness of breath or chest pain with exertion.  Diabetes has been uncontrolled with recent hemoglobin A1c of 10.4%.  02/10/2022  Audrey King returns today for follow-up of her testing.  She underwent a sleep study that was interpreted by Dr. Daphene Jaeger, this indicated severe sleep apnea with an AHI of 31.9 events per hour, worse at 40.6/h in supine sleep.  Based on these findings, CPAP was recommended.  This is in progress.  She also underwent CT coronary angiography.  This demonstrated at least moderate  multivessel coronary disease and a calcium score 337, 98th percentile for age and sex matched control.  Aortic atherosclerosis and enlarged pulmonary artery were noted.  FFR was performed and was negative therefore no obstructive disease was suspected.  She returns today and says she is feeling fairly well.  She has tolerated increased dose of rosuvastatin now 40 mg daily.  Her total cholesterol is much lower at 108, HDL 39, triglycerides 85 and LDL 52.  This is at target.  Her blood pressure is also well controlled at 120/78.  She is working on lowering her A1c which was previously 10.4%.  She has a follow-up with a diabetes educator on June 12.  02/17/2023  Audrey King returns today for follow-up.  Overall her laboratory work seems significantly improved.  He A1c is down to 7.9 and have progressively decreased by about 1% every few months over the past several years.  Her vitamin D level is now normal at 39.8.  Her cholesterol has improved significantly as well.  Total 102, triglycerides 113, HDL 43 and LDL 38.  She is on high-dose rosuvastatin 40 mg daily.  She is on Rybelsus and has had a minimal amount of weight loss.  EKG is stable with a right bundle branch block.  No chest pain or shortness of breath.  She has had some issues with her CPAP.  She used it pretty regularly for about a year but now says she gets some anxiety wearing it and feels like it is  causing a lot of pressure and difficulty sleeping.  08/24/2023  Audrey King returns today for follow-up. She is doing well. Weight has come down another 6 lbs - she is on Rybelsus and Jardiance - A1c is much lower at 6.2%.  Overall she says she is feeling much better.  Her cholesterol 9 months ago was very low at 38.  Her rosuvastatin was then decreased by her PCP from 40 to 20 mg.  Blood pressure is also low normal today.  With her weight loss we may in the future be able to reduce her blood pressure medication.  She has been reestablished on CPAP with  newer settings which she says she is tolerating better.  She is due for repeat lipids and will try to obtain those today as she is fasting.  PMHx:  Past Medical History:  Diagnosis Date   Allergy    Chronic back pain    Diabetes mellitus without complication (HCC)    Hyperlipidemia    Hypertension     Past Surgical History:  Procedure Laterality Date   CESAREAN SECTION     TOTAL ABDOMINAL HYSTERECTOMY W/ BILATERAL SALPINGOOPHORECTOMY  2010    FAMHx:  Family History  Problem Relation Age of Onset   Hypertension Mother    Diabetes Mother    Heart disease Father 86       MI multiple, stents, CAD   Kidney disease Father    Hypertension Father    Diabetes Father    Cancer Maternal Aunt        colon   Breast cancer Paternal Aunt    Cancer Paternal Aunt        breast   Breast cancer Paternal Grandmother    Cancer Paternal Grandmother        breast    SOCHx:   reports that she has never smoked. She has never used smokeless tobacco. She reports that she does not drink alcohol and does not use drugs.  ALLERGIES:  Allergies  Allergen Reactions   Gabapentin     Crying spells and head fuzzy feeling   Metformin And Related     Nausea and vomiting within 30 min of eating   Penicillins Swelling    Has patient had a PCN reaction causing immediate rash, facial/tongue/throat swelling, SOB or lightheadedness with hypotension:YES Has patient had a PCN reaction causing severe rash involving mucus membranes or skin necrosis: NO Has patient had a PCN reaction that required hospitalization NO Has patient had a PCN reaction occurring within the last 10 years: NO If all of the above answers are "NO", then may proceed with Cephalosporin use.    ROS: Pertinent items noted in HPI and remainder of comprehensive ROS otherwise negative.  HOME MEDS: Current Outpatient Medications on File Prior to Visit  Medication Sig Dispense Refill   aspirin EC 81 MG tablet Take 1 tablet (81 mg total)  by mouth daily. Swallow whole. 90 tablet 3   cetirizine (ZYRTEC) 10 MG tablet Take 10 mg by mouth daily.     empagliflozin (JARDIANCE) 10 MG TABS tablet Take 1 tablet (10 mg total) by mouth daily before breakfast. 90 tablet 3   FIBER COMPLETE PO Take by mouth.     Multiple Vitamin (MULTIVITAMIN) tablet Take 1 tablet by mouth daily.     Probiotic Product (PROBIOTIC BLEND PO) Take by mouth.     rosuvastatin (CRESTOR) 40 MG tablet Take 1 tablet (40 mg total) by mouth daily. 90 tablet 3   Semaglutide (  RYBELSUS) 14 MG TABS Take 1 tablet (14 mg total) by mouth daily. 90 tablet 1   telmisartan-hydrochlorothiazide (MICARDIS HCT) 80-12.5 MG tablet Take 1 tablet by mouth daily. telmisartan 80 mg-hydrochlorothiazide 12.5 mg tablet 90 tablet 3   TRESIBA FLEXTOUCH 100 UNIT/ML FlexTouch Pen INJECT 52 UNITS INTO THE SKIN DAILY AT 10PM 15 mL 11   omeprazole (PRILOSEC) 20 MG capsule Take 20 mg by mouth daily. (Patient not taking: Reported on 08/24/2023)     No current facility-administered medications on file prior to visit.    LABS/IMAGING: No results found for this or any previous visit (from the past 48 hour(s)). No results found.  LIPID PANEL:    Component Value Date/Time   CHOL 102 11/11/2022 0903   TRIG 113 11/11/2022 0903   HDL 43 11/11/2022 0903   CHOLHDL 2.4 11/11/2022 0903   LDLCALC 38 11/11/2022 0903    WEIGHTS: Wt Readings from Last 3 Encounters:  08/24/23 189 lb 6.4 oz (85.9 kg)  07/08/23 188 lb 12.8 oz (85.6 kg)  04/03/23 196 lb 6.4 oz (89.1 kg)    VITALS: BP 104/66 (BP Location: Left Arm, Patient Position: Sitting, Cuff Size: Normal)   Pulse 77   Ht 5\' 4"  (1.626 m)   Wt 189 lb 6.4 oz (85.9 kg)   SpO2 98%   BMI 32.51 kg/m   EXAM: General appearance: alert, no distress, and moderately obese Neck: no carotid bruit, no JVD, and thyroid not enlarged, symmetric, no tenderness/mass/nodules Lungs: clear to auscultation bilaterally Heart: regular rate and rhythm, S1, S2 normal,  no murmur, click, rub or gallop Abdomen: soft, non-tender; bowel sounds normal; no masses,  no organomegaly Extremities: extremities normal, atraumatic, no cyanosis or edema Pulses: 2+ and symmetric Skin: Skin color, texture, turgor normal. No rashes or lesions Neurologic: Grossly normal Psych: Pleasant  EKG: EKG Interpretation Date/Time:  Monday August 24 2023 08:12:11 EST Ventricular Rate:  72 PR Interval:  156 QRS Duration:  146 QT Interval:  414 QTC Calculation: 453 R Axis:   0  Text Interpretation: Normal sinus rhythm Right bundle branch block Possible Lateral infarct , age undetermined No significant change since last tracing Confirmed by Zoila Shutter [phone]) on 08/24/2023 8:15:31 AM    ASSESSMENT: Type 2 diabetes-uncontrolled, A1c 10.4% -> improved to 6.4% RBBB CAD with CAC score 337, 98th percentile for age and sex matched controls (12/2021) Hypertension Dyslipidemia, goal LDL less than 70 Family history of premature coronary disease in her father Severe OSA  PLAN: 1.   Audrey King continues to do well.  She is lost additional weight and her hemoglobin A1c is very low now at 6.4%.  She denies any chest pain or worsening shortness of breath.  She has more able to tolerate CPAP.  Blood pressure is low normal with her recent weight loss we may need to make adjustments on her medication.  She had her rosuvastatin decreased from 40 to 20 mg daily.  She has not had lipids reassessed in several months but I would recommend that today.  Plan follow-up with me otherwise annually or sooner as necessary.  Chrystie Nose, MD, [facility], FACP  [place]  [facility]  Medical Director of the [facility] Lipid [facility]  Cardiovascular Risk Reduction Clinic Diplomate of the American Board of Clinical Lipidology Attending Cardiologist  Direct Dial: [phone]  Fax: [phone]  Website:  [email or web address] Nicely Shayanna Thatch 08/24/2023, 8:15 AM

## 2023-08-25 LAB — LIPID PANEL
Chol/HDL Ratio: 2.4 {ratio} (ref 0.0–4.4)
Cholesterol, Total: 113 mg/dL (ref 100–199)
HDL: 47 mg/dL (ref >39)
LDL Chol Calc (NIH): 53 mg/dL (ref 0–99)
Triglycerides: 57 mg/dL (ref 0–149)
VLDL Cholesterol Cal: 13 mg/dL (ref 5–40)

## 2023-09-08 NOTE — Telephone Encounter (Signed)
Care team updated. Results already in [facility]

## 2023-11-03 ENCOUNTER — Other Ambulatory Visit: Payer: Commercial Managed Care - PPO

## 2023-11-09 ENCOUNTER — Other Ambulatory Visit: Payer: Self-pay | Admitting: Medical

## 2023-11-09 DIAGNOSIS — Z1231 Encounter for screening mammogram for malignant neoplasm of breast: Secondary | ICD-10-CM

## 2023-11-18 ENCOUNTER — Encounter: Payer: Self-pay | Admitting: Medical

## 2023-11-18 ENCOUNTER — Encounter: Payer: Commercial Managed Care - PPO | Admitting: Medical

## 2023-11-18 ENCOUNTER — Ambulatory Visit (INDEPENDENT_AMBULATORY_CARE_PROVIDER_SITE_OTHER): Payer: Commercial Managed Care - PPO | Admitting: Medical

## 2023-11-18 VITALS — BP 110/60 | HR 81 | Ht 63.5 in | Wt 185.6 lb

## 2023-11-18 DIAGNOSIS — I10 Essential (primary) hypertension: Secondary | ICD-10-CM | POA: Diagnosis not present

## 2023-11-18 DIAGNOSIS — E1169 Type 2 diabetes mellitus with other specified complication: Secondary | ICD-10-CM

## 2023-11-18 DIAGNOSIS — M79602 Pain in left arm: Secondary | ICD-10-CM

## 2023-11-18 DIAGNOSIS — M79601 Pain in right arm: Secondary | ICD-10-CM | POA: Insufficient documentation

## 2023-11-18 DIAGNOSIS — R200 Anesthesia of skin: Secondary | ICD-10-CM | POA: Insufficient documentation

## 2023-11-18 DIAGNOSIS — Z0000 Encounter for general adult medical examination without abnormal findings: Secondary | ICD-10-CM

## 2023-11-18 DIAGNOSIS — R202 Paresthesia of skin: Secondary | ICD-10-CM | POA: Insufficient documentation

## 2023-11-18 DIAGNOSIS — R809 Proteinuria, unspecified: Secondary | ICD-10-CM

## 2023-11-18 DIAGNOSIS — E785 Hyperlipidemia, unspecified: Secondary | ICD-10-CM

## 2023-11-18 DIAGNOSIS — E118 Type 2 diabetes mellitus with unspecified complications: Secondary | ICD-10-CM

## 2023-11-18 DIAGNOSIS — E8940 Asymptomatic postprocedural ovarian failure: Secondary | ICD-10-CM

## 2023-11-18 MED ORDER — TIZANIDINE HCL 4 MG PO TABS: 4.0000 mg | ORAL_TABLET | Freq: Two times a day (BID) | ORAL | 0 refills | Status: DC | PRN

## 2023-11-18 NOTE — Progress Notes (Signed)
 Subjective:   HPI  Audrey King is a 58 y.o. female who presents for Chief Complaint  Patient presents with   Annual Exam    Fasting cpe, no other concerns    Patient Care Team: Raelea Gosse, Kermit Balo, PA-C as PCP - General (Family Medicine) [facility] (Optometry) Rennis Golden, Lisette Abu, MD as Consulting Physician (Cardiology) Sees dentist Sees eye doctor Dr. Charna Elizabeth, GI Dr. Zoila Shutter, cardiology   Concerns: Here for physical.  She has a history of brachial plexus issues in the past and is starting to flareup again.  She notes that during the nighttime when trying to sleep she gets numb or pains in both arms sometimes 1 side depending if she is sleeping on that side or not.  Basically she has a sleep flat on her back at night to try to avoid her arm due to hurting her going numb.  She has been on medication years ago for this but did not tolerate certain medications.  She works 2 jobs, she also has elderly parents she has to check on regularly.  They live in [place].  She is compliant with CPAP.  She is compliant with her medications in general  Diabetes-compliant with Rybelsus 14 mg daily, insulin degludec 52 units daily, Jardiance 10mg  daily, checking feet daily, sugars running 90-121 fasting.  Hypertension-compliant with telmisartan HCT 80/12.5mg  daily.  Hyperlipidemia-compliant with rosuvastatin 40mg  daily and aspirin 81mg  daily   Reviewed their medical, surgical, family, social, medication, and allergy history and updated chart as appropriate.  Past Medical History:  Diagnosis Date   Allergy    Chronic back pain    Diabetes mellitus without complication (HCC)    Hyperlipidemia    Hypertension     Family History  Problem Relation Age of Onset   Hypertension Mother    Diabetes Mother    Heart disease Father 47       MI multiple, stents, CAD   Kidney disease Father    Hypertension Father    Diabetes Father    Cancer Maternal Aunt        colon    Breast cancer Paternal Aunt    Cancer Paternal Aunt        breast   Breast cancer Paternal Grandmother    Cancer Paternal Grandmother        breast     Current Outpatient Medications:    aspirin EC 81 MG tablet, Take 1 tablet (81 mg total) by mouth daily. Swallow whole., Disp: 90 tablet, Rfl: 3   cetirizine (ZYRTEC) 10 MG tablet, Take 10 mg by mouth daily., Disp: , Rfl:    empagliflozin (JARDIANCE) 10 MG TABS tablet, Take 1 tablet (10 mg total) by mouth daily before breakfast., Disp: 90 tablet, Rfl: 3   FIBER COMPLETE PO, Take by mouth., Disp: , Rfl:    Multiple Vitamin (MULTIVITAMIN) tablet, Take 1 tablet by mouth daily., Disp: , Rfl:    omeprazole (PRILOSEC) 20 MG capsule, Take 20 mg by mouth daily., Disp: , Rfl:    Probiotic Product (PROBIOTIC BLEND PO), Take by mouth., Disp: , Rfl:    rosuvastatin (CRESTOR) 40 MG tablet, Take 1 tablet (40 mg total) by mouth daily., Disp: 90 tablet, Rfl: 3   Semaglutide (RYBELSUS) 14 MG TABS, Take 1 tablet (14 mg total) by mouth daily., Disp: 90 tablet, Rfl: 1   telmisartan-hydrochlorothiazide (MICARDIS HCT) 80-12.5 MG tablet, Take 1 tablet by mouth daily. telmisartan 80 mg-hydrochlorothiazide 12.5 mg tablet, Disp: 90 tablet, Rfl: 3  tiZANidine (ZANAFLEX) 4 MG tablet, Take 1 tablet (4 mg total) by mouth 2 (two) times daily as needed for muscle spasms., Disp: 30 tablet, Rfl: 0   TRESIBA FLEXTOUCH 100 UNIT/ML FlexTouch Pen, INJECT 52 UNITS INTO THE SKIN DAILY AT 10PM, Disp: 15 mL, Rfl: 11  Allergies  Allergen Reactions   Gabapentin     Crying spells and head fuzzy feeling   Metformin And Related     Nausea and vomiting within 30 min of eating   Penicillins Swelling    Has patient had a PCN reaction causing immediate rash, facial/tongue/throat swelling, SOB or lightheadedness with hypotension:YES Has patient had a PCN reaction causing severe rash involving mucus membranes or skin necrosis: NO Has patient had a PCN reaction that required  hospitalization NO Has patient had a PCN reaction occurring within the last 10 years: NO If all of the above answers are "NO", then may proceed with Cephalosporin use.   Review of Systems  Constitutional:  Negative for chills, fever, malaise/fatigue and weight loss.  HENT:  Negative for congestion, ear pain, hearing loss, sore throat and tinnitus.   Eyes:  Negative for blurred vision, pain and redness.  Respiratory:  Negative for cough, hemoptysis and shortness of breath.   Cardiovascular:  Negative for chest pain, palpitations, orthopnea, claudication and leg swelling.  Gastrointestinal:  Negative for abdominal pain, blood in stool, constipation, diarrhea, nausea and vomiting.  Genitourinary:  Negative for dysuria, flank pain, frequency, hematuria and urgency.  Musculoskeletal:  Positive for myalgias and neck pain. Negative for falls and joint pain.  Skin:  Negative for itching and rash.  Neurological:  Positive for tingling. Negative for dizziness, speech change, weakness and headaches.  Endo/Heme/Allergies:  Negative for polydipsia. Does not bruise/bleed easily.  Psychiatric/Behavioral:  Negative for depression and memory loss. The patient is not nervous/anxious and does not have insomnia.         11/18/2023   10:16 AM 11/11/2022    8:32 AM 07/22/2022   12:05 PM 04/21/2022    3:57 PM 03/03/2022    9:20 AM  Depression screen PHQ 2/9  Decreased Interest 0 0 0 0 0  Down, Depressed, Hopeless 0 0 0 0 0  PHQ - 2 Score 0 0 0 0 0       Objective:  BP 110/60   Pulse 81   Ht 5' 3.5" (1.613 m)   Wt 185 lb 9.6 oz (84.2 kg)   SpO2 98%   BMI 32.36 kg/m   Wt Readings from Last 3 Encounters:  11/18/23 185 lb 9.6 oz (84.2 kg)  08/24/23 189 lb 6.4 oz (85.9 kg)  07/08/23 188 lb 12.8 oz (85.6 kg)   BP Readings from Last 3 Encounters:  11/18/23 110/60  08/24/23 104/66  07/08/23 110/70    General appearance: alert, no distress, WD/WN, Caucasian female MSK and back: Somewhat tender  left upper back paraspinal region, nontender of the arm to palpation, range of motion relatively normal of shoulders and arms.  Rest of arms and legs unremarkable HEENT: normocephalic, conjunctiva/corneas normal, sclerae anicteric, PERRLA, EOMi, nares patent, mild mucoid discharge, positive erythema, pharynx normal Oral cavity: MMM, tongue normal, teeth in good repair Neck: supple, no lymphadenopathy, no thyromegaly, no masses, normal ROM, no bruits Chest: non tender, normal shape and expansion Heart: RRR, normal S1, S2, no murmurs Lungs: CTA bilaterally, no wheezes, rhonchi, or rales Abdomen: +bs, soft, non tender, non distended, no masses, no hepatomegaly, no splenomegaly, no bruits Extremities: no edema, no cyanosis,  no clubbing Pulses: 2+ symmetric, upper and lower extremities, normal cap refill Neurological: alert, oriented x 3, strength normal, normal sensation of extremities, negative Phalen's or Tinel's, CN2-12 intact, strength normal upper extremities and lower extremities, sensation normal throughout, DTRs 2+ throughout, no cerebellar signs, gait normal Psychiatric: normal affect, behavior normal, pleasant  Breast/gyn/rectal - deferred/declined  Diabetic Foot Exam - Simple   Simple Foot Form Diabetic Foot exam was performed with the following findings: Yes 11/18/2023 11:16 AM  Visual Inspection See comments: Yes Sensation Testing Intact to touch and monofilament testing bilaterally: Yes Pulse Check See comments: Yes Comments 1+ pedal pulses, somewhat flat arches bilat      Assessment and Plan :   Encounter Diagnoses  Name Primary?   Type 2 diabetes mellitus treated without insulin (HCC)    Encounter for health maintenance examination in adult Yes   Essential hypertension, benign    Diabetes mellitus type 2 with complications (HCC)    Hyperlipidemia associated with type 2 diabetes mellitus (HCC)    Microalbuminuria    Surgical menopause    Bilateral arm numbness and  tingling while sleeping    Pain in both upper extremities     This visit was a preventative care visit, also known as wellness visit or routine physical.   Topics typically include healthy lifestyle, diet, exercise, preventative care, vaccinations, sick and well care, proper use of emergency dept and after hours care, as well as other concerns.     Recommendations: Continue to return yearly for your annual wellness and preventative care visits.  This gives [place] a chance to discuss healthy lifestyle, exercise, vaccinations, review your chart record, and perform screenings where appropriate.  I recommend you see your eye doctor yearly for routine vision care.  I recommend you see your dentist yearly for routine dental care including hygiene visits twice yearly.   Vaccination recommendations were reviewed Immunization History  Administered Date(s) Administered   Influenza,inj,Quad PF,6+ Mos 06/24/2021, 07/22/2022   Influenza-Unspecified 06/23/2023   PFIZER(Purple Top)SARS-COV-2 Vaccination 11/30/2019, 12/21/2019, 09/05/2020   PNEUMOCOCCAL CONJUGATE-20 12/31/2020   Tdap 06/06/2021   Zoster Recombinant(Shingrix) 09/22/2014, 06/06/2021     Screening for cancer: Colon cancer screening: March 2023 colonoscopy showing diverticulitis, otherwise normal, repeat in 10 years  Breast cancer screening: You should perform a self breast exam monthly.   We reviewed recommendations for regular mammograms and breast cancer screening.  Continue routine screening yearly  Cervical cancer screening: Status post hysterectomy  Skin cancer screening: Check your skin regularly for new changes, growing lesions, or other lesions of concern Come in for evaluation if you have skin lesions of concern.  Lung cancer screening: If you have a greater than 20 pack year history of tobacco use, then you may qualify for lung cancer screening with a chest CT scan.   Please call your insurance company to inquire about  coverage for this test.  We currently don't have screenings for other cancers besides breast, cervical, colon, and lung cancers.  If you have a strong family history of cancer or have other cancer screening concerns, please let me know.    Bone health: Get at least 150 minutes of aerobic exercise weekly Get weight bearing exercise at least once weekly Bone density test:  A bone density test is an imaging test that uses a type of X-ray to measure the amount of calcium and other minerals in your bones. The test may be used to diagnose or screen you for a condition that causes weak or  thin bones (osteoporosis), predict your risk for a broken bone (fracture), or determine how well your osteoporosis treatment is working. The bone density test is recommended for females 65 and older, or females or males <65 if certain risk factors such as thyroid disease, long term use of steroids such as for asthma or rheumatological issues, vitamin D deficiency, estrogen deficiency, family history of osteoporosis, self or family history of fragility fracture in first degree relative.  Baseline bone density test was normal July 2023   Heart health: Get at least 150 minutes of aerobic exercise weekly Limit alcohol It is important to maintain a healthy blood pressure and healthy cholesterol numbers  Heart disease screening: Screening for heart disease includes screening for blood pressure, fasting lipids, glucose/diabetes screening, BMI height to weight ratio, reviewed of smoking status, physical activity, and diet.    Goals include blood pressure 120/80 or less, maintaining a healthy lipid/cholesterol profile, preventing diabetes or keeping diabetes numbers under good control, not smoking or using tobacco products, exercising most days per week or at least 150 minutes per week of exercise, and eating healthy variety of fruits and vegetables, healthy oils, and avoiding unhealthy food choices like fried food, fast  food, high sugar and high cholesterol foods.     CT coronary test showed coronary artery disease and aortic atherosclerosis last year April 2023   Medical care options: I recommend you continue to seek care here first for routine care.  We try really hard to have available appointments Monday through Friday daytime hours for sick visits, acute visits, and physicals.  Urgent care should be used for after hours and weekends for significant issues that cannot wait till the next day.  The emergency department should be used for significant potentially life-threatening emergencies.  The emergency department is expensive, can often have long wait times for less significant concerns, so try to utilize primary care, urgent care, or telemedicine when possible to avoid unnecessary trips to the emergency department.  Virtual visits and telemedicine have been introduced since the pandemic started in 2020, and can be convenient ways to receive medical care.  We offer virtual appointments as well to assist you in a variety of options to seek medical care.    Advanced Directives: I recommend you consider completing a Health Care Power of Attorney and Living Will.   These documents respect your wishes and help alleviate burdens on your loved ones if you were to become terminally ill or be in a position to need those documents enforced.    You can complete Advanced Directives yourself, have them notarized, then have copies made for our office, for you and for anybody you feel should have them in safe keeping.  Or, you can have an attorney prepare these documents.   If you haven't updated your Last Will and Testament in a while, it may be worthwhile having an attorney prepare these documents together and save on some costs.       Separate significant issues discussed: Diabetes Updated labs today Continue glucose monitoring, goal 130 glucose or less in the mornings fasting Continue yearly eye doctor visits and  make sure they send [place] a copy of the diabetic eye exam Check your feet daily for sores or wounds Continue Jardiance 10 mg daily, Rybelsus 14 mg daily, Tresiba 52 units nightly  Hypertension  Continue telmisartan HCT 80/12.5 mg daily  Hyperlipidemia  Updated labs to follow Continue Crestor 40 mg daily, aspirin 81 mg daily  Sleep apnea Continue CPAP  therapy Work on efforts to lose weight  Arm pain, arm numbness-discussed possible causes including radicular issues from the neck, bulging disc, carpal tunnel syndrome or other.  Referral to physical therapy.  Can use muscle relaxer at night which she has used before with some improvement.  Avoid positions that make it worse.  Stretch regularly.  Adira was seen today for annual exam.  Diagnoses and all orders for this visit:  Encounter for health maintenance examination in adult -     Hemoglobin A1c -     Basic metabolic panel -     Parathyroid hormone, intact (no Ca) -     Microalbumin/Creatinine Ratio, Urine -     CBC -     Vitamin B12 -     TSH  Type 2 diabetes mellitus treated without insulin (HCC) -     Cancel: Urine Albumin/Creatinine with ratio (send out) [LAB689] -     Hemoglobin A1c  Essential hypertension, benign  Diabetes mellitus type 2 with complications (HCC) -     Hemoglobin A1c  Hyperlipidemia associated with type 2 diabetes mellitus (HCC)  Microalbuminuria -     Microalbumin/Creatinine Ratio, Urine  Surgical menopause  Bilateral arm numbness and tingling while sleeping -     Ambulatory referral to Physical Therapy -     Vitamin B12 -     TSH  Pain in both upper extremities -     Ambulatory referral to Physical Therapy -     Vitamin B12 -     TSH  Other orders -     tiZANidine (ZANAFLEX) 4 MG tablet; Take 1 tablet (4 mg total) by mouth 2 (two) times daily as needed for muscle spasms.    Follow-up pending labs, yearly for physical

## 2023-11-19 LAB — CBC
Hematocrit: 39.4 % (ref 34.0–46.6)
Hemoglobin: 13.6 g/dL (ref 11.1–15.9)
MCH: 31.6 pg (ref 26.6–33.0)
MCHC: 34.5 g/dL (ref 31.5–35.7)
MCV: 91 fL (ref 79–97)
Platelets: 259 10*3/uL (ref 150–450)
RBC: 4.31 x10E6/uL (ref 3.77–5.28)
RDW: 12.6 % (ref 11.7–15.4)
WBC: 10.6 10*3/uL (ref 3.4–10.8)

## 2023-11-19 LAB — BASIC METABOLIC PANEL
BUN/Creatinine Ratio: 45 — ABNORMAL HIGH (ref 9–23)
BUN: 34 mg/dL — ABNORMAL HIGH (ref 6–24)
CO2: 24 mmol/L (ref 20–29)
Calcium: 10.8 mg/dL — ABNORMAL HIGH (ref 8.7–10.2)
Chloride: 99 mmol/L (ref 96–106)
Creatinine, Ser: 0.75 mg/dL (ref 0.57–1.00)
Glucose: 74 mg/dL (ref 70–99)
Potassium: 4.4 mmol/L (ref 3.5–5.2)
Sodium: 138 mmol/L (ref 134–144)
eGFR: 92 mL/min/{1.73_m2} (ref >59)

## 2023-11-19 LAB — MICROALBUMIN / CREATININE URINE RATIO
Creatinine, Urine: 78 mg/dL
Microalb/Creat Ratio: 5 mg/g{creat} (ref 0–29)
Microalbumin, Urine: 4.2 ug/mL

## 2023-11-19 LAB — PARATHYROID HORMONE, INTACT (NO CA): PTH: 20 pg/mL (ref 15–65)

## 2023-11-19 LAB — HEMOGLOBIN A1C
Est. average glucose Bld gHb Est-mCnc: 128 mg/dL
Hgb A1c MFr Bld: 6.1 % — ABNORMAL HIGH (ref 4.8–5.6)

## 2023-11-20 ENCOUNTER — Other Ambulatory Visit: Payer: Self-pay | Admitting: Medical

## 2023-11-20 DIAGNOSIS — E785 Hyperlipidemia, unspecified: Secondary | ICD-10-CM

## 2023-11-20 MED ORDER — ROSUVASTATIN CALCIUM 40 MG PO TABS: 40.0000 mg | ORAL_TABLET | Freq: Every day | ORAL | 3 refills | Status: AC

## 2023-11-20 MED ORDER — RYBELSUS 14 MG PO TABS
1.0000 | ORAL_TABLET | Freq: Every day | ORAL | 1 refills | Status: DC
Start: 2023-11-20 — End: 2024-05-19

## 2023-11-20 MED ORDER — TELMISARTAN-HCTZ 80-12.5 MG PO TABS: 1.0000 | ORAL_TABLET | Freq: Every day | ORAL | 3 refills | Status: DC

## 2023-11-20 MED ORDER — EMPAGLIFLOZIN 10 MG PO TABS: 10.0000 mg | ORAL_TABLET | Freq: Every day | ORAL | 3 refills | Status: AC

## 2023-11-20 MED ORDER — TRESIBA FLEXTOUCH 100 UNIT/ML ~~LOC~~ SOPN: PEN_INJECTOR | SUBCUTANEOUS | 11 refills | Status: AC

## 2023-11-20 MED ORDER — ASPIRIN 81 MG PO TBEC: 81.0000 mg | DELAYED_RELEASE_TABLET | Freq: Every day | ORAL | 3 refills | Status: AC

## 2023-11-20 NOTE — Progress Notes (Signed)
 Results sent through MyChart

## 2023-12-14 ENCOUNTER — Encounter: Payer: Commercial Managed Care - PPO | Admitting: Medical

## 2023-12-28 ENCOUNTER — Ambulatory Visit
Admission: RE | Admit: 2023-12-28 | Discharge: 2023-12-28 | Disposition: A | Discharge status: Home / Self Care | Payer: Commercial Managed Care - PPO | Source: Ambulatory Visit | Attending: Medical | Admitting: Medical

## 2023-12-28 DIAGNOSIS — Z1231 Encounter for screening mammogram for malignant neoplasm of breast: Secondary | ICD-10-CM

## 2024-01-12 ENCOUNTER — Other Ambulatory Visit (HOSPITAL_COMMUNITY): Payer: Self-pay

## 2024-01-12 ENCOUNTER — Telehealth: Payer: Self-pay

## 2024-01-12 NOTE — Telephone Encounter (Signed)
 Pharmacy Patient Advocate Encounter   Received notification from CoverMyMeds that prior authorization for Rybelsus  14MG  tablets is required/requested.   Insurance verification completed.   The patient is insured through [facility] .   Per test claim: PA required; PA submitted to above mentioned insurance via CoverMyMeds Key/confirmation #/EOC (Key: [id number])     Status is pending

## 2024-01-12 NOTE — Telephone Encounter (Signed)
 Pharmacy Patient Advocate Encounter  Insurance verification completed.   The patient is insured through [facility]   Ran test claim for Rybelsus  14MG  tablets. Currently a quantity of 30 is a 30 day supply and the co-pay is 300.00 with a [employer] on file         This test claim was processed through [employer]- copay amounts may vary at other pharmacies due to [employer], or as the patient moves through the different stages of their insurance plan.

## 2024-01-15 ENCOUNTER — Other Ambulatory Visit (HOSPITAL_COMMUNITY): Payer: Self-pay

## 2024-01-15 NOTE — Telephone Encounter (Addendum)
 Update:  Per pts plan a determination can take up to 15 days   Contact: [phone]

## 2024-01-18 ENCOUNTER — Other Ambulatory Visit (HOSPITAL_COMMUNITY): Payer: Self-pay

## 2024-01-18 NOTE — Telephone Encounter (Signed)
 Per faxed received via plan:  A prior Authorization isnt required at this time, as one is in place until 09/21/2221

## 2024-05-18 ENCOUNTER — Ambulatory Visit (INDEPENDENT_AMBULATORY_CARE_PROVIDER_SITE_OTHER): Payer: Commercial Managed Care - PPO | Admitting: Medical

## 2024-05-18 VITALS — BP 108/68 | HR 82 | Wt 189.2 lb

## 2024-05-18 DIAGNOSIS — I10 Essential (primary) hypertension: Secondary | ICD-10-CM

## 2024-05-18 DIAGNOSIS — L729 Follicular cyst of the skin and subcutaneous tissue, unspecified: Secondary | ICD-10-CM

## 2024-05-18 DIAGNOSIS — G4733 Obstructive sleep apnea (adult) (pediatric): Secondary | ICD-10-CM

## 2024-05-18 DIAGNOSIS — E785 Hyperlipidemia, unspecified: Secondary | ICD-10-CM

## 2024-05-18 DIAGNOSIS — L659 Nonscarring hair loss, unspecified: Secondary | ICD-10-CM

## 2024-05-18 DIAGNOSIS — E118 Type 2 diabetes mellitus with unspecified complications: Secondary | ICD-10-CM

## 2024-05-18 DIAGNOSIS — E1169 Type 2 diabetes mellitus with other specified complication: Secondary | ICD-10-CM

## 2024-05-18 DIAGNOSIS — Z23 Encounter for immunization: Secondary | ICD-10-CM

## 2024-05-18 NOTE — Patient Instructions (Addendum)
 Hypertension: Your blood pressures had been running too low lately at home.  For the time being cut your telmisartan  HCT 80/12.5 mg in half and do 1/2 tablet daily.  Monitor your blood pressures as you have been doing.  The goal is around 120/70.    If your numbers are still running low over the next 3 to 4 weeks then I will change you to plain telmisartan  40 mg by itself.  If your numbers are running closer to borderline such as 130/80 after making that change then we may keep you on a telmisartan  HCT 40/12.5 mg  Send me a MyChart message or call or drop by your blood pressure numbers in about a month.   Regarding hair loss, consider the following treatment options: OTC biotin supplement OTC collagen supplement Womens Rogaine OTC OTC fish oil supplement   Diabetes Continue Rybelsus  14mg  daily Continue Tresiba  52 units daily Continue Jardiance  10mg  daily Continue glucose monitoring   Hyperlipidemia  Continue Rosuvastatin  40mg  daily and aspirin  81mg  daily

## 2024-05-18 NOTE — Progress Notes (Signed)
 Subjective:  Audrey King is a 58 y.o. female who presents for Chief Complaint  Patient presents with   Medical Management of Chronic Issues    Diabetes, having some hair loss, and has a bump on stomach underneath skin      Here for med check  Hypertension-compliant with telmisartan  HCT 80/12.5 mg daily.  However blood pressures have been running low lately for the last 2 months.  Blood pressures are running averaging anywhere from 94/63-107/70.  Diabetes-compliant Rybelsus  14 mg daily, Tresiba  52 units daily, Jardiance  10 mg daily.  Blood sugars are looking good at home.  At goal less than 130 fasting.  No hypoglycemic  Hyperlipidemia-compliant with rosuvastatin  40 mg daily and aspirin  81 mg daily.  She does take vitamin D  supplement but not sure of the dose she is taking.  She thinks it may be 2000 units daily OTC  She notes some hair thinning activity on top of the scalp lately.  No individual patches of the remaining superior scalp.  Her weight fluctuates up and down 5 pounds here there.  No other aggravating or relieving factors.    No other c/o.  Past Medical History:  Diagnosis Date   Allergy    Chronic back pain    Diabetes mellitus without complication (HCC)    Hyperlipidemia    Hypertension    Current Outpatient Medications on File Prior to Visit  Medication Sig Dispense Refill   aspirin  EC 81 MG tablet Take 1 tablet (81 mg total) by mouth daily. Swallow whole. 90 tablet 3   cetirizine (ZYRTEC) 10 MG tablet Take 10 mg by mouth daily.     empagliflozin  (JARDIANCE ) 10 MG TABS tablet Take 1 tablet (10 mg total) by mouth daily before breakfast. 90 tablet 3   FIBER COMPLETE PO Take by mouth.     Multiple Vitamin (MULTIVITAMIN) tablet Take 1 tablet by mouth daily.     omeprazole (PRILOSEC) 20 MG capsule Take 20 mg by mouth daily.     Probiotic Product (PROBIOTIC BLEND PO) Take by mouth.     rosuvastatin  (CRESTOR ) 40 MG tablet Take 1 tablet (40 mg total) by mouth  daily. 90 tablet 3   Semaglutide  (RYBELSUS ) 14 MG TABS Take 1 tablet (14 mg total) by mouth daily. 90 tablet 1   telmisartan -hydrochlorothiazide (MICARDIS  HCT) 80-12.5 MG tablet Take 1 tablet by mouth daily. telmisartan  80 mg-hydrochlorothiazide 12.5 mg tablet 90 tablet 3   TRESIBA  FLEXTOUCH 100 UNIT/ML FlexTouch Pen INJECT 52 UNITS INTO THE SKIN DAILY AT 10PM 15 mL 11   No current facility-administered medications on file prior to visit.     The following portions of the patient's history were reviewed and updated as appropriate: allergies, current medications, past family history, past medical history, past social history, past surgical history and problem list.  ROS Otherwise as in subjective above  Objective: BP 108/68   Pulse 82   Wt 189 lb 3.2 oz (85.8 kg)   SpO2 100%   BMI 32.99 kg/m   General appearance: alert, no distress, well developed, well nourished Neck: supple, no lymphadenopathy, no thyromegaly, no masses Heart: RRR, normal S1, S2, no murmurs Lungs: CTA bilaterally, no wheezes, rhonchi, or rales Abdomen: +bs, right of umbilicus inferior to the umbilicus is a 3-1/2 cm x 2 cm nodular cystic lesion somewhat deep within the fatty tissue, suggestive of a cyst, otherwise soft, non tender, non distended, no masses, no hepatomegaly, no splenomegaly Pulses: 2+ radial pulses, 2+ pedal pulses, normal cap  refill Ext: no edema   Assessment: Encounter Diagnoses  Name Primary?   Diabetes mellitus type 2 with complications (HCC) Yes   Hypercalcemia    Cyst of skin    Hair loss    Essential hypertension, benign    Hyperlipidemia associated with type 2 diabetes mellitus (HCC)    OSA (obstructive sleep apnea)      Plan: Hypertension: Your blood pressures had been running too low lately at home.  For the time being cut your telmisartan  HCT 80/12.5 mg in half and do 1/2 tablet daily.  Monitor your blood pressures as you have been doing.  The goal is around 120/70.    If  your numbers are still running low over the next 3 to 4 weeks then I will change you to plain telmisartan  40 mg by itself.  If your numbers are running closer to borderline such as 130/80 after making that change then we may keep you on a telmisartan  HCT 40/12.5 mg  Send me a MyChart message or call or drop by your blood pressure numbers in about a month.   Regarding hair loss, consider the following treatment options: OTC biotin supplement OTC collagen supplement Womens Rogaine OTC OTC fish oil supplement   Diabetes Continue Rybelsus  14mg  daily Continue Tresiba  52 units daily Continue Jardiance  10mg  daily Continue glucose monitoring   Hyperlipidemia  Continue Rosuvastatin  40mg  daily and aspirin  81mg  daily   Cyst of skin - likely sebaceous cyst.  Discussed US  vs watch and wait approach.  She will continue with watch and wait approach for now.  [name] was seen today for medical management of chronic issues.  Diagnoses and all orders for this visit:  Diabetes mellitus type 2 with complications (HCC) -     Comprehensive metabolic panel with GFR -     Hemoglobin A1c  Hypercalcemia -     VITAMIN D  25 Hydroxy (Vit-D Deficiency, Fractures)  Cyst of skin  Hair loss  Essential hypertension, benign  Hyperlipidemia associated with type 2 diabetes mellitus (HCC)  OSA (obstructive sleep apnea)    Follow up: pending labs

## 2024-05-19 ENCOUNTER — Ambulatory Visit: Payer: Self-pay | Admitting: Medical

## 2024-05-19 LAB — COMPREHENSIVE METABOLIC PANEL WITH GFR
ALT: 23 IU/L (ref 0–32)
AST: 23 IU/L (ref 0–40)
Albumin: 4.3 g/dL (ref 3.8–4.9)
Alkaline Phosphatase: 43 IU/L — ABNORMAL LOW (ref 44–121)
BUN/Creatinine Ratio: 30 — ABNORMAL HIGH (ref 9–23)
BUN: 26 mg/dL — ABNORMAL HIGH (ref 6–24)
Bilirubin Total: 0.7 mg/dL (ref 0.0–1.2)
CO2: 23 mmol/L (ref 20–29)
Calcium: 10.2 mg/dL (ref 8.7–10.2)
Chloride: 99 mmol/L (ref 96–106)
Creatinine, Ser: 0.88 mg/dL (ref 0.57–1.00)
Globulin, Total: 1.9 g/dL (ref 1.5–4.5)
Glucose: 80 mg/dL (ref 70–99)
Potassium: 4 mmol/L (ref 3.5–5.2)
Sodium: 138 mmol/L (ref 134–144)
Total Protein: 6.2 g/dL (ref 6.0–8.5)
eGFR: 76 mL/min/1.73 (ref >59)

## 2024-05-19 LAB — HEMOGLOBIN A1C
Est. average glucose Bld gHb Est-mCnc: 131 mg/dL
Hgb A1c MFr Bld: 6.2 % — ABNORMAL HIGH (ref 4.8–5.6)

## 2024-05-19 LAB — VITAMIN D 25 HYDROXY (VIT D DEFICIENCY, FRACTURES): Vit D, 25-Hydroxy: 49.1 ng/mL (ref 30.0–100.0)

## 2024-05-19 MED ORDER — RYBELSUS 14 MG PO TABS: 1.0000 | ORAL_TABLET | Freq: Every day | ORAL | 1 refills | Status: DC

## 2024-05-19 NOTE — Addendum Note (Signed)
 Addended by: BULAH ALM RAMAN on: 05/19/2024 07:20 AM   Modules accepted: Orders

## 2024-05-19 NOTE — Progress Notes (Signed)
 See if we can add a copper level given low alkaline phosphatase  Results through MyChart

## 2024-06-03 ENCOUNTER — Other Ambulatory Visit

## 2024-06-03 ENCOUNTER — Other Ambulatory Visit: Payer: Self-pay | Admitting: Medical

## 2024-06-03 DIAGNOSIS — R748 Abnormal levels of other serum enzymes: Secondary | ICD-10-CM

## 2024-06-03 MED ORDER — TELMISARTAN 40 MG PO TABS: 40.0000 mg | ORAL_TABLET | Freq: Every day | ORAL | 1 refills | Status: DC

## 2024-06-03 NOTE — Progress Notes (Signed)
 Results or MyChart

## 2024-06-08 ENCOUNTER — Ambulatory Visit: Payer: Self-pay | Admitting: Medical

## 2024-06-08 LAB — COPPER, SERUM: Copper: 101 ug/dL (ref 80–158)

## 2024-06-08 NOTE — Progress Notes (Signed)
 Results through MyChart

## 2024-06-28 ENCOUNTER — Telehealth: Payer: Self-pay | Admitting: Internal Medicine

## 2024-06-28 ENCOUNTER — Ambulatory Visit (INDEPENDENT_AMBULATORY_CARE_PROVIDER_SITE_OTHER)

## 2024-06-28 DIAGNOSIS — Z23 Encounter for immunization: Secondary | ICD-10-CM | POA: Diagnosis not present

## 2024-06-28 DIAGNOSIS — R609 Edema, unspecified: Secondary | ICD-10-CM

## 2024-06-28 NOTE — Telephone Encounter (Signed)
 Patient came by for a flu shot today and dropped off sugar and BP reading. Placed this in your folder

## 2024-06-28 NOTE — Telephone Encounter (Signed)
 Pt was notified of results

## 2024-07-01 MED ORDER — FUROSEMIDE 20 MG PO TABS: 20.0000 mg | ORAL_TABLET | Freq: Every day | ORAL | 3 refills | Status: AC

## 2024-07-08 ENCOUNTER — Other Ambulatory Visit

## 2024-07-15 ENCOUNTER — Other Ambulatory Visit (HOSPITAL_COMMUNITY): Payer: Self-pay

## 2024-07-15 ENCOUNTER — Telehealth: Payer: Self-pay | Admitting: Internal Medicine

## 2024-07-15 ENCOUNTER — Telehealth: Payer: Self-pay

## 2024-07-15 NOTE — Telephone Encounter (Signed)
 Request:   No P/A REQ'D

## 2024-07-15 NOTE — Telephone Encounter (Signed)
 Pt was notified but she can't remember why she couldn't be on mounjaro  (possibly copay was too expensive) . She will stay on Tresiba  and jardiance  and let us  know if her sugars go down or drop. Her sugar was 80 today  Copied from CRM #[id number]. Topic: Clinical - Medication Prior Auth >> Jul 15, 2024 11:05 AM Delon HERO wrote: Reason for CRM: Patient is calling to report that her Semaglutide  (RYBELSUS ) 14 MG TABS [[id number]] is over $900. Requesting a PA.

## 2024-07-18 NOTE — Telephone Encounter (Signed)
 The patient called back in checking on the status of the prior authorization for her Semaglutide  (RYBELSUS ) 14 MG TABS as she just took her last one this morning. Please assist patient further.

## 2024-07-18 NOTE — Telephone Encounter (Signed)
 Patient is informed, she states she will call insurance.

## 2024-07-20 NOTE — Telephone Encounter (Signed)
 Pt is aware.

## 2024-07-20 NOTE — Telephone Encounter (Signed)
 Until patient meet her dedicutble, she will have to pay out of pocket $900. She can not afford this

## 2024-07-21 NOTE — Telephone Encounter (Signed)
 She doesn't remember why she was switched. She can't recall if it was insurance issues or too pricey

## 2024-07-22 NOTE — Telephone Encounter (Signed)
 Pt was notified.

## 2024-07-26 ENCOUNTER — Other Ambulatory Visit: Payer: Self-pay | Admitting: Medical

## 2024-08-30 ENCOUNTER — Ambulatory Visit: Payer: Self-pay | Admitting: *Deleted

## 2024-08-30 NOTE — Telephone Encounter (Signed)
 FYI Only or Action Required?: Action required by provider: update on patient condition.  Patient was last seen in primary care on 05/18/2024 by Bulah Alm RAMAN, PA-C.  Called Nurse Triage reporting Hyperglycemia.  Symptoms began yesterday.  Interventions attempted: Nothing.  Symptoms are: gradually worsening.  Triage Disposition: Call PCP Within 24 Hours  Patient/caregiver understands and will follow disposition?: Yes  Copied from CRM #[id number]. Topic: Clinical - Red Word Triage >> Aug 30, 2024  2:03 PM Deaijah H wrote: Red Word that prompted transfer to Nurse Triage: Patient would like to let Dr. Bulah nurse know she is off medication Semaglutide  (RYBELSUS ) 14 MG TABS due to price and he advised her if it gets higher than 130 needed to call. BS over 130 currently, 195 this morning. Reason for Disposition  [1] Caller has NON-URGENT medication or insulin  device (e.g., pump, continuous monitoring) question AND [2] triager unable to answer question  Answer Assessment - Initial Assessment Questions Patient advised to inform provider if am glucose went over 130:  1. BLOOD GLUCOSE: What is your blood glucose level?      195- this morning, 130 yesterday 2. ONSET: When did you check the blood glucose?     First elevated reading- past couple days has seemed to rise.  3. USUAL RANGE: What is your glucose level usually? (e.g., usual fasting morning value, usual evening value)     Morning value- 125 range  5. TYPE 1 or 2:  Do you know what type of diabetes you have?  (e.g., Type 1, Type 2, Gestational; doesn't know)      Type 2 6. INSULIN : Do you take insulin ? What type of insulin (s) do you use? What is the mode of delivery? (syringe, pen; injection or pump)?      Tresiba  7. DIABETES PILLS: Do you take any pills for your diabetes? If Yes, ask: Have you missed taking any pills recently?     Jardience  8. OTHER SYMPTOMS: Do you have any symptoms? (e.g., fever, frequent  urination, difficulty breathing, dizziness, weakness, vomiting)     No  Protocols used: Diabetes - High Blood Sugar-A-AH

## 2024-08-31 NOTE — Telephone Encounter (Signed)
See mychart message from patient.

## 2024-10-11 ENCOUNTER — Other Ambulatory Visit: Payer: Self-pay | Admitting: Medical

## 2024-10-11 MED ORDER — MOUNJARO 7.5 MG/0.5ML ~~LOC~~ SOAJ: 7.5000 mg | SUBCUTANEOUS | 0 refills | Status: AC

## 2024-10-13 ENCOUNTER — Telehealth: Payer: Self-pay | Admitting: Pharmacy Technician

## 2024-10-13 ENCOUNTER — Other Ambulatory Visit (HOSPITAL_COMMUNITY): Payer: Self-pay

## 2024-10-13 NOTE — Telephone Encounter (Signed)
 Pharmacy Patient Advocate Encounter   Received notification from [employer] that prior authorization for Mounjaro  7.5MG /0.5ML auto-injectors is required/requested.   Insurance verification completed.   The patient is insured through [facility].   Per test claim: PA required; PA started via CoverMyMeds. KEY [id number] . Waiting for clinical questions to populate.

## 2024-10-17 ENCOUNTER — Other Ambulatory Visit (HOSPITAL_COMMUNITY): Payer: Self-pay

## 2024-10-17 NOTE — Telephone Encounter (Signed)
 Pharmacy Patient Advocate Encounter  Received notification from [facility] that Prior Authorization for Mounjaro  7.5MG /0.5ML auto-injectors has been APPROVED from 10/17/24 to 10/16/25   PA #/Case ID/Reference #: [id number]

## 2024-11-23 ENCOUNTER — Encounter: Payer: Self-pay | Admitting: Medical
